# Patient Record
Sex: Female | Born: 1960 | Race: White | Hispanic: No | Marital: Married | Smoking: Never smoker
Health system: Southern US, Community
[De-identification: ages and names within clinical notes are randomized; demographics above are authoritative.]

## PROBLEM LIST (undated history)

## (undated) DIAGNOSIS — F329 Major depressive disorder, single episode, unspecified: Secondary | ICD-10-CM

## (undated) DIAGNOSIS — I10 Essential (primary) hypertension: Secondary | ICD-10-CM

## (undated) DIAGNOSIS — E079 Disorder of thyroid, unspecified: Secondary | ICD-10-CM

## (undated) DIAGNOSIS — F32A Depression, unspecified: Secondary | ICD-10-CM

## (undated) HISTORY — PX: REDUCTION MAMMAPLASTY: SUR839

## (undated) HISTORY — DX: Disorder of thyroid, unspecified: E07.9

## (undated) HISTORY — DX: Essential (primary) hypertension: I10

## (undated) HISTORY — DX: Major depressive disorder, single episode, unspecified: F32.9

## (undated) HISTORY — DX: Depression, unspecified: F32.A

## (undated) HISTORY — PX: BREAST BIOPSY: SHX20

---

## 1999-11-13 HISTORY — PX: GASTRIC BYPASS: SHX52

## 2003-10-13 HISTORY — PX: BREAST REDUCTION SURGERY: SHX8

## 2007-03-13 HISTORY — PX: ABDOMINOPLASTY: SUR9

## 2007-12-08 ENCOUNTER — Encounter: Payer: Self-pay | Admitting: Family Medicine

## 2008-02-03 ENCOUNTER — Encounter: Payer: Self-pay | Admitting: Family Medicine

## 2008-08-05 ENCOUNTER — Encounter: Payer: Self-pay | Admitting: Family Medicine

## 2008-12-16 ENCOUNTER — Encounter (INDEPENDENT_AMBULATORY_CARE_PROVIDER_SITE_OTHER): Payer: Self-pay | Admitting: *Deleted

## 2008-12-31 ENCOUNTER — Ambulatory Visit: Payer: Self-pay | Admitting: Family Medicine

## 2008-12-31 ENCOUNTER — Encounter (INDEPENDENT_AMBULATORY_CARE_PROVIDER_SITE_OTHER): Payer: Self-pay | Admitting: *Deleted

## 2008-12-31 DIAGNOSIS — E039 Hypothyroidism, unspecified: Secondary | ICD-10-CM

## 2008-12-31 DIAGNOSIS — I10 Essential (primary) hypertension: Secondary | ICD-10-CM

## 2008-12-31 DIAGNOSIS — F329 Major depressive disorder, single episode, unspecified: Secondary | ICD-10-CM

## 2008-12-31 DIAGNOSIS — J309 Allergic rhinitis, unspecified: Secondary | ICD-10-CM | POA: Insufficient documentation

## 2008-12-31 LAB — CONVERTED CEMR LAB
ALT: 47 units/L — ABNORMAL HIGH (ref 0–35)
AST: 32 units/L (ref 0–37)
Albumin: 3.9 g/dL (ref 3.5–5.2)
BUN: 10 mg/dL (ref 6–23)
CO2: 27 meq/L (ref 19–32)
Calcium: 9.3 mg/dL (ref 8.4–10.5)
Chloride: 106 meq/L (ref 96–112)
Creatinine, Ser: 0.7 mg/dL (ref 0.4–1.2)
Glucose, Bld: 90 mg/dL (ref 70–99)
TSH: 1.08 microintl units/mL (ref 0.35–5.50)
Total Bilirubin: 0.7 mg/dL (ref 0.3–1.2)
Total Protein: 7.4 g/dL (ref 6.0–8.3)

## 2009-01-07 ENCOUNTER — Encounter: Payer: Self-pay | Admitting: Family Medicine

## 2009-01-17 ENCOUNTER — Encounter (INDEPENDENT_AMBULATORY_CARE_PROVIDER_SITE_OTHER): Payer: Self-pay | Admitting: *Deleted

## 2009-02-25 ENCOUNTER — Ambulatory Visit: Payer: Self-pay | Admitting: Internal Medicine

## 2009-02-25 DIAGNOSIS — R319 Hematuria, unspecified: Secondary | ICD-10-CM

## 2009-02-25 DIAGNOSIS — R109 Unspecified abdominal pain: Secondary | ICD-10-CM

## 2009-02-25 LAB — CONVERTED CEMR LAB
Bacteria, UA: NONE SEEN
Glucose, Urine, Semiquant: NEGATIVE
Nitrite: NEGATIVE
Specific Gravity, Urine: 1.01
WBC Urine, dipstick: NEGATIVE
WBC, UA: NONE SEEN cells/hpf (ref <3)

## 2009-02-26 ENCOUNTER — Encounter: Payer: Self-pay | Admitting: Internal Medicine

## 2009-03-01 ENCOUNTER — Telehealth (INDEPENDENT_AMBULATORY_CARE_PROVIDER_SITE_OTHER): Payer: Self-pay | Admitting: *Deleted

## 2009-03-11 ENCOUNTER — Ambulatory Visit: Payer: Self-pay | Admitting: Family Medicine

## 2009-03-11 LAB — CONVERTED CEMR LAB
Bacteria, UA: NONE SEEN
Basophils Absolute: 0.1 10*3/uL (ref 0.0–0.1)
Basophils Relative: 0.7 % (ref 0.0–3.0)
Eosinophils Relative: 2.5 % (ref 0.0–5.0)
GFR calc non Af Amer: 113.03 mL/min (ref >60)
Glucose, Bld: 81 mg/dL (ref 70–99)
Glucose, Urine, Semiquant: NEGATIVE
HCT: 41.5 % (ref 36.0–46.0)
Hemoglobin: 14.6 g/dL (ref 12.0–15.0)
Ketones, urine, test strip: NEGATIVE
Lymphs Abs: 1.8 10*3/uL (ref 0.7–4.0)
Monocytes Relative: 8.2 % (ref 3.0–12.0)
Neutro Abs: 4.9 10*3/uL (ref 1.4–7.7)
Potassium: 4.3 meq/L (ref 3.5–5.1)
RBC: 4.58 M/uL (ref 3.87–5.11)
RDW: 12.1 % (ref 11.5–14.6)
Sodium: 141 meq/L (ref 135–145)
pH: 6

## 2009-03-12 ENCOUNTER — Encounter: Payer: Self-pay | Admitting: Family Medicine

## 2009-03-14 ENCOUNTER — Telehealth: Payer: Self-pay | Admitting: Family Medicine

## 2009-03-14 ENCOUNTER — Telehealth (INDEPENDENT_AMBULATORY_CARE_PROVIDER_SITE_OTHER): Payer: Self-pay | Admitting: *Deleted

## 2009-03-16 ENCOUNTER — Encounter: Admission: RE | Admit: 2009-03-16 | Discharge: 2009-03-16 | Payer: Self-pay | Admitting: Family Medicine

## 2009-03-18 DIAGNOSIS — R1909 Other intra-abdominal and pelvic swelling, mass and lump: Secondary | ICD-10-CM

## 2009-04-06 ENCOUNTER — Encounter: Payer: Self-pay | Admitting: Family Medicine

## 2009-04-19 ENCOUNTER — Ambulatory Visit: Payer: Self-pay | Admitting: Family Medicine

## 2009-04-19 LAB — CONVERTED CEMR LAB
Bilirubin Urine: NEGATIVE
Glucose, Urine, Semiquant: NEGATIVE
Ketones, urine, test strip: NEGATIVE
Protein, U semiquant: NEGATIVE

## 2009-08-29 ENCOUNTER — Telehealth (INDEPENDENT_AMBULATORY_CARE_PROVIDER_SITE_OTHER): Payer: Self-pay | Admitting: *Deleted

## 2009-09-02 ENCOUNTER — Telehealth (INDEPENDENT_AMBULATORY_CARE_PROVIDER_SITE_OTHER): Payer: Self-pay | Admitting: *Deleted

## 2009-09-06 ENCOUNTER — Telehealth (INDEPENDENT_AMBULATORY_CARE_PROVIDER_SITE_OTHER): Payer: Self-pay | Admitting: *Deleted

## 2009-09-08 ENCOUNTER — Telehealth (INDEPENDENT_AMBULATORY_CARE_PROVIDER_SITE_OTHER): Payer: Self-pay | Admitting: *Deleted

## 2009-09-12 ENCOUNTER — Ambulatory Visit: Payer: Self-pay | Admitting: Family Medicine

## 2009-10-11 ENCOUNTER — Ambulatory Visit: Payer: Self-pay | Admitting: Family Medicine

## 2009-10-11 ENCOUNTER — Encounter: Payer: Self-pay | Admitting: Family Medicine

## 2009-10-11 DIAGNOSIS — J019 Acute sinusitis, unspecified: Secondary | ICD-10-CM

## 2009-10-20 ENCOUNTER — Telehealth: Payer: Self-pay | Admitting: Family Medicine

## 2009-10-31 ENCOUNTER — Ambulatory Visit: Payer: Self-pay | Admitting: Family Medicine

## 2009-10-31 ENCOUNTER — Other Ambulatory Visit: Admission: RE | Admit: 2009-10-31 | Discharge: 2009-10-31 | Payer: Self-pay | Admitting: Family Medicine

## 2009-11-01 ENCOUNTER — Telehealth (INDEPENDENT_AMBULATORY_CARE_PROVIDER_SITE_OTHER): Payer: Self-pay | Admitting: *Deleted

## 2009-11-01 LAB — CONVERTED CEMR LAB
Albumin: 4 g/dL (ref 3.5–5.2)
Basophils Relative: 0.6 % (ref 0.0–3.0)
CO2: 26 meq/L (ref 19–32)
Calcium: 9.4 mg/dL (ref 8.4–10.5)
Chloride: 111 meq/L (ref 96–112)
Cholesterol: 198 mg/dL (ref 0–200)
Creatinine, Ser: 0.8 mg/dL (ref 0.4–1.2)
Eosinophils Absolute: 0.2 10*3/uL (ref 0.0–0.7)
Eosinophils Relative: 2.9 % (ref 0.0–5.0)
HDL: 50.2 mg/dL (ref >39.00)
Hemoglobin: 14.5 g/dL (ref 12.0–15.0)
Iron: 105 ug/dL (ref 42–145)
LDL Cholesterol: 123 mg/dL — ABNORMAL HIGH (ref 0–99)
Lymphocytes Relative: 21.3 % (ref 12.0–46.0)
MCHC: 33.5 g/dL (ref 30.0–36.0)
MCV: 93.3 fL (ref 78.0–100.0)
Neutro Abs: 4.2 10*3/uL (ref 1.4–7.7)
Neutrophils Relative %: 67.6 % (ref 43.0–77.0)
RBC: 4.65 M/uL (ref 3.87–5.11)
Saturation Ratios: 32 % (ref 20.0–50.0)
Sodium: 146 meq/L — ABNORMAL HIGH (ref 135–145)
Total CHOL/HDL Ratio: 4
Total Protein: 7.5 g/dL (ref 6.0–8.3)
Triglycerides: 126 mg/dL (ref 0.0–149.0)
VLDL: 25.2 mg/dL (ref 0.0–40.0)
Vit D, 25-Hydroxy: 13 ng/mL — ABNORMAL LOW (ref 30–89)
Vitamin B-12: 439 pg/mL (ref 211–911)
WBC: 6.2 10*3/uL (ref 4.5–10.5)

## 2009-11-10 ENCOUNTER — Encounter (INDEPENDENT_AMBULATORY_CARE_PROVIDER_SITE_OTHER): Payer: Self-pay | Admitting: *Deleted

## 2010-01-18 ENCOUNTER — Telehealth (INDEPENDENT_AMBULATORY_CARE_PROVIDER_SITE_OTHER): Payer: Self-pay | Admitting: *Deleted

## 2010-01-20 ENCOUNTER — Ambulatory Visit: Payer: Self-pay | Admitting: Family Medicine

## 2010-02-16 ENCOUNTER — Telehealth (INDEPENDENT_AMBULATORY_CARE_PROVIDER_SITE_OTHER): Payer: Self-pay | Admitting: *Deleted

## 2010-03-01 ENCOUNTER — Telehealth (INDEPENDENT_AMBULATORY_CARE_PROVIDER_SITE_OTHER): Payer: Self-pay | Admitting: *Deleted

## 2010-03-27 ENCOUNTER — Telehealth (INDEPENDENT_AMBULATORY_CARE_PROVIDER_SITE_OTHER): Payer: Self-pay | Admitting: *Deleted

## 2010-04-13 ENCOUNTER — Ambulatory Visit: Payer: Self-pay | Admitting: Family Medicine

## 2010-04-13 LAB — CONVERTED CEMR LAB: TSH: 1.21 microintl units/mL (ref 0.35–5.50)

## 2010-05-01 ENCOUNTER — Telehealth (INDEPENDENT_AMBULATORY_CARE_PROVIDER_SITE_OTHER): Payer: Self-pay | Admitting: *Deleted

## 2010-05-03 ENCOUNTER — Telehealth (INDEPENDENT_AMBULATORY_CARE_PROVIDER_SITE_OTHER): Payer: Self-pay | Admitting: *Deleted

## 2010-10-31 ENCOUNTER — Telehealth (INDEPENDENT_AMBULATORY_CARE_PROVIDER_SITE_OTHER): Payer: Self-pay | Admitting: *Deleted

## 2010-12-04 ENCOUNTER — Encounter: Payer: Self-pay | Admitting: Family Medicine

## 2010-12-12 NOTE — Progress Notes (Signed)
Summary: Refill Request  Phone Note Refill Request Message from:  Pharmacy on [address]  on [facility] Fax #: [phone]  Refills Requested: Medication #1:  SYNTHROID 137 MCG TABS 1 by mouth daily- LABS DUE NOW   Dosage confirmed as above?Dosage Confirmed   Brand Name Necessary? No   Supply Requested: 1 month   Last Refilled: 02/16/2010 Next Appointment Scheduled: none Initial call taken by: Harold Barban,  February 16, 2010 9:24 AM    Prescriptions: SYNTHROID 137 MCG TABS (LEVOTHYROXINE SODIUM) 1 by mouth daily- LABS DUE NOW  #30 x 0   Entered by:   Army Fossa CMA   Authorized by:   Loreen Freud DO   Signed by:   Army Fossa CMA on 02/16/2010   Method used:   Printed then faxed to ...         RxID:   [id number]

## 2010-12-12 NOTE — Progress Notes (Signed)
Summary: tooth infection  Phone Note Call from Patient Call back at [phone]   Caller: Patient Summary of Call: spoke w/ patient says she just completed keflex for root canal after tooth infection had some nausea and no fever but noted some chills informed patient could be from current infection and give medication sometime since still her system also to take tyenol as needed if she feels like she has fever and f/u at the end of the week if no better may need further evaluation of symptoms patient agreed and will call back at end of week if no better. Initial call taken by: Doristine Devoid,  May 03, 2010 1:53 PM

## 2010-12-12 NOTE — Progress Notes (Signed)
Summary: synthroid refill   Phone Note Refill Request Message from:  Fax from Pharmacy on January 18, 2010 10:46 AM  Refills Requested: Medication #1:  SYNTHROID 137 MCG TABS 1 by mouth daily.[facility] fax [phone]   Method Requested: Fax to Local Pharmacy Next Appointment Scheduled: no appt Initial call taken by: Barb Merino,  January 18, 2010 10:46 AM    New/Updated Medications: SYNTHROID 137 MCG TABS (LEVOTHYROXINE SODIUM) 1 by mouth daily- LABS DUE NOW Prescriptions: SYNTHROID 137 MCG TABS (LEVOTHYROXINE SODIUM) 1 by mouth daily- LABS DUE NOW  #30 x 0   Entered by:   Doristine Devoid   Authorized by:   Neena Rhymes MD   Signed by:   Doristine Devoid on 01/18/2010   Method used:   Printed then faxed to ...         RxID:   [id number]

## 2010-12-12 NOTE — Progress Notes (Signed)
Summary: Refill Request(lmom 4/20,4/21)  Phone Note Refill Request Message from:  Pharmacy on [address] on [facility] Fax #: [phone]  Refills Requested: Medication #1:  SYNTHROID 137 MCG TABS 1 by mouth daily- LABS DUE NOW   Dosage confirmed as above?Dosage Confirmed   Brand Name Necessary? No   Supply Requested: 1 month   Last Refilled: 02/22/2010 Next Appointment Scheduled: none Initial call taken by: Harold Barban,  March 01, 2010 9:37 AM  Follow-up for Phone Call        left message for pt to call back. med was refilled on 02/16/10 she should not need medication. Army Fossa CMA  March 01, 2010 10:15 AM   Additional Follow-up for Phone Call Additional follow up Details #1::        Left message to call back. Army Fossa CMA  March 02, 2010 11:50 AM

## 2010-12-12 NOTE — Progress Notes (Signed)
Summary: Refill Requests  Phone Note Refill Request Call back at [phone] Message from:  Pharmacy on Mar 27, 2010 8:51 AM  Refills Requested: Medication #1:  LISINOPRIL 20 MG TABS 1 tablet by mouth daily   Dosage confirmed as above?Dosage Confirmed   Supply Requested: 1 month   Last Refilled: 02/24/2010  Medication #2:  SYNTHROID 137 MCG TABS 1 by mouth daily- LABS DUE NOW   Dosage confirmed as above?Dosage Confirmed   Brand Name Necessary? No   Supply Requested: 1 month   Last Refilled: 02/22/2010  Medication #3:  EFFEXOR XR 150 MG XR24H-CAP 1 by mouth once daily   Dosage confirmed as above?Dosage Confirmed   Brand Name Necessary? No   Supply Requested: 1 month   Last Refilled: 02/21/2010 [facility] on [facility] in [place]  Next Appointment Scheduled: none Initial call taken by: Harold Barban,  Mar 27, 2010 8:51 AM    Prescriptions: SYNTHROID 137 MCG TABS (LEVOTHYROXINE SODIUM) 1 by mouth daily- LABS DUE NOW  #30 x 0   Entered by:   Doristine Devoid   Authorized by:   Neena Rhymes MD   Signed by:   Doristine Devoid on 03/27/2010   Method used:   Printed then faxed to ...         RxID:   [id number] LISINOPRIL 20 MG TABS (LISINOPRIL) 1 tablet by mouth daily  #30 x 0   Entered by:   Doristine Devoid   Authorized by:   Neena Rhymes MD   Signed by:   Doristine Devoid on 03/27/2010   Method used:   Printed then faxed to ...         RxID:   [id number] EFFEXOR XR 150 MG XR24H-CAP (VENLAFAXINE HCL) 1 by mouth once daily  #30 x 0   Entered by:   Doristine Devoid   Authorized by:   Neena Rhymes MD   Signed by:   Doristine Devoid on 03/27/2010   Method used:   Printed then faxed to ...         RxID:   [id number]

## 2010-12-12 NOTE — Assessment & Plan Note (Signed)
Summary: sinus infection/kdc   Vital Signs:  Patient profile:   50 year old female Height:      65 inches Weight:      222 pounds BMI:     37.08 Temp:     98.0 degrees F oral Pulse rate:   82 / minute Pulse rhythm:   regular BP sitting:   122 / 80  (left arm) Cuff size:   large  Vitals Entered By: Army Fossa CMA (January 20, 2010 2:52 PM) CC: Pt c/o pressure in her face, coughing, head congestion x 2-3 days., URI symptoms   History of Present Illness:       This is a 50 year old woman who presents with URI symptoms.  The symptoms began 3 days ago.  The patient complains of nasal congestion, sore throat, dry cough, and sick contacts.  The patient denies fever, low-grade fever (<100.5 degrees), fever of 100.5-103 degrees, fever of 103.1-104 degrees, fever to >104 degrees, stiff neck, dyspnea, wheezing, rash, vomiting, diarrhea, use of an antipyretic, and response to antipyretic.  The patient also reports headache.  The patient denies itchy watery eyes, itchy throat, sneezing, seasonal symptoms, response to antihistamine, muscle aches, and severe fatigue.  Risk factors for Strep sinusitis include unilateral facial pain.  The patient denies the following risk factors for Strep sinusitis: unilateral nasal discharge, poor response to decongestant, double sickening, tooth pain, Strep exposure, tender adenopathy, and absence of cough.  Pt tried Comtrex last night.  Cough is worse at night.    Current Medications (verified): 1)  Effexor Xr 150 Mg Xr24h-Cap (Venlafaxine Hcl) .Marland Kitchen.. 1 By Mouth Once Daily 2)  Estrace 0.1 Mg/gm Crea (Estradiol) .... Use As Directed 3)  Claritin 10 Mg Tabs (Loratadine) .... Take 1 Tab Once Daily 4)  Vitamin D 1000 Unit Tabs (Cholecalciferol) .... Take 1 Tab Once Daily 5)  Vitamin C 500 Mg Tabs (Ascorbic Acid) .... Take 1 Tab Once Daily 6)  Cranberry 1680 Mg .... Take 2 Tab Once Daily 7)  Vitamin B-12 1000 Mcg Subl (Cyanocobalamin) .Marland Kitchen.. 1 By Mouth Weekly. 8)   Lisinopril 20 Mg Tabs (Lisinopril) .Marland Kitchen.. 1 Tablet By Mouth Daily 9)  Veramyst 27.5 Mcg/spray Susp (Fluticasone Furoate) 10)  Diflucan 150 Mg Tabs (Fluconazole) .Marland Kitchen.. 1 By Mouth X1 . May Repeat in 3 Days As Needed 11)  Synthroid 137 Mcg Tabs (Levothyroxine Sodium) .Marland Kitchen.. 1 By Mouth Daily- Labs Due Now 12)  Augmentin 875-125 Mg Tabs (Amoxicillin-Pot Clavulanate) .Marland Kitchen.. 1 By Mouth Two Times A Day 13)  Cheratussin Ac 100-10 Mg/77ml Syrp (Guaifenesin-Codeine) .Marland Kitchen.. 1-2 Tsp By Mouth At Bedtime  Allergies (verified): No Known Drug Allergies  Past History:  Past medical, surgical, family and social histories (including risk factors) reviewed for relevance to current acute and chronic problems.  Past Medical History: Reviewed history from 12/31/2008 and no changes required. HTN depression  Past Surgical History: Reviewed history from 01/17/2009 and no changes required. Gastric bypass-2001 Mammoplasty-10/2003 Abdominoplasty-03/2007 Broken foot- 1999  Family History: Reviewed history from 12/31/2008 and no changes required. dm-father htn-mother stroke-father coronary artery disease- paternal gf breast ca- paternal aunt bladder Ca-father, paternal GM  Social History: Reviewed history from 12/31/2008 and no changes required. recently moved from [place], [place] before that but family in [place] ex-husband is gay, 2 children- 5, 98 no tobacco beer nightly no drugs  Review of Systems      See HPI  Physical Exam  General:  Well-developed,well-nourished,in no acute distress; alert,appropriate and cooperative throughout examination Ears:  External ear exam shows no significant lesions or deformities.  Otoscopic examination reveals clear canals, tympanic membranes are intact bilaterally without bulging, retraction, inflammation or discharge. Hearing is grossly normal bilaterally. Nose:  L frontal sinus tenderness and L maxillary sinus tenderness.   Mouth:  Oral mucosa and oropharynx without  lesions or exudates.  Teeth in good repair. Neck:  No deformities, masses, or tenderness noted. Lungs:  Normal respiratory effort, chest expands symmetrically. Lungs are clear to auscultation, no crackles or wheezes. Heart:  normal rate and no murmur.   Psych:  Oriented X3 and normally interactive.     Impression & Recommendations:  Problem # 1:  SINUSITIS - ACUTE-NOS (ICD-461.9)  Her updated medication list for this problem includes:    Veramyst 27.5 Mcg/spray Susp (Fluticasone furoate)    Augmentin 875-125 Mg Tabs (Amoxicillin-pot clavulanate) .Marland Kitchen... 1 by mouth two times a day    Cheratussin Ac 100-10 Mg/53ml Syrp (Guaifenesin-codeine) .Marland Kitchen... 1-2 tsp by mouth at bedtime  Instructed on treatment. Call if symptoms persist or worsen.   Complete Medication List: 1)  Effexor Xr 150 Mg Xr24h-cap (Venlafaxine hcl) .Marland Kitchen.. 1 by mouth once daily 2)  Estrace 0.1 Mg/gm Crea (Estradiol) .... Use as directed 3)  Claritin 10 Mg Tabs (Loratadine) .... Take 1 tab once daily 4)  Vitamin D 1000 Unit Tabs (Cholecalciferol) .... Take 1 tab once daily 5)  Vitamin C 500 Mg Tabs (Ascorbic acid) .... Take 1 tab once daily 6)  Cranberry 1680 Mg  .... Take 2 tab once daily 7)  Vitamin B-12 1000 Mcg Subl (Cyanocobalamin) .Marland Kitchen.. 1 by mouth weekly. 8)  Lisinopril 20 Mg Tabs (Lisinopril) .Marland Kitchen.. 1 tablet by mouth daily 9)  Veramyst 27.5 Mcg/spray Susp (Fluticasone furoate) 10)  Diflucan 150 Mg Tabs (Fluconazole) .Marland Kitchen.. 1 by mouth x1 . may repeat in 3 days as needed 11)  Synthroid 137 Mcg Tabs (Levothyroxine sodium) .Marland Kitchen.. 1 by mouth daily- labs due now 12)  Augmentin 875-125 Mg Tabs (Amoxicillin-pot clavulanate) .Marland Kitchen.. 1 by mouth two times a day 13)  Cheratussin Ac 100-10 Mg/30ml Syrp (Guaifenesin-codeine) .Marland Kitchen.. 1-2 tsp by mouth at bedtime Prescriptions: CHERATUSSIN AC 100-10 MG/5ML SYRP (GUAIFENESIN-CODEINE) 1-2 tsp by mouth at bedtime  #6 oz x 0   Entered and Authorized by:   Loreen Freud DO   Signed by:   Loreen Freud DO on  01/20/2010   Method used:   Print then Give to Patient   RxID:   [id number] AUGMENTIN 875-125 MG TABS (AMOXICILLIN-POT CLAVULANATE) 1 by mouth two times a day  #20 x 0   Entered and Authorized by:   Loreen Freud DO   Signed by:   Loreen Freud DO on 01/20/2010   Method used:   Electronically to        [facility] [phone][facility] (retail)       [address]       [place], [place]  [address]       Ph: [phone]       Fax: [phone]   RxID:   [id number]

## 2010-12-12 NOTE — Progress Notes (Signed)
Summary: synthroid,effexor,lisinopril refill   Phone Note Refill Request Call back at [phone] Message from:  Pharmacy on May 01, 2010 9:31 AM  Refills Requested: Medication #1:  SYNTHROID 137 MCG TABS 1 by mouth daily- LABS DUE NOW   Dosage confirmed as above?Dosage Confirmed   Brand Name Necessary? No   Supply Requested: 1 month   Last Refilled: 03/27/2010  Medication #2:  EFFEXOR XR 150 MG XR24H-CAP 1 by mouth once daily   Dosage confirmed as above?Dosage Confirmed   Brand Name Necessary? No   Supply Requested: 1 month   Last Refilled: 03/27/2010  Medication #3:  LISINOPRIL 20 MG TABS 1 tablet by mouth daily   Dosage confirmed as above?Dosage Confirmed   Supply Requested: 1 month   Last Refilled: 03/27/2010 [facility] on [facility] in [place]  Next Appointment Scheduled: none Initial call taken by: Harold Barban,  May 01, 2010 9:32 AM    New/Updated Medications: SYNTHROID 137 MCG TABS (LEVOTHYROXINE SODIUM) 1 by mouth daily Prescriptions: SYNTHROID 137 MCG TABS (LEVOTHYROXINE SODIUM) 1 by mouth daily  #30 x 6   Entered by:   Doristine Devoid   Authorized by:   Neena Rhymes MD   Signed by:   Doristine Devoid on 05/01/2010   Method used:   Printed then faxed to ...         RxID:   [id number] LISINOPRIL 20 MG TABS (LISINOPRIL) 1 tablet by mouth daily  #30 x 6   Entered by:   Doristine Devoid   Authorized by:   Neena Rhymes MD   Signed by:   Doristine Devoid on 05/01/2010   Method used:   Printed then faxed to ...         RxID:   [id number] EFFEXOR XR 150 MG XR24H-CAP (VENLAFAXINE HCL) 1 by mouth once daily  #30 x 6   Entered by:   Doristine Devoid   Authorized by:   Neena Rhymes MD   Signed by:   Doristine Devoid on 05/01/2010   Method used:   Printed then faxed to ...         RxID:   [id number]

## 2010-12-14 NOTE — Progress Notes (Signed)
Summary: refill- due cpx and labs for further refills   Phone Note Refill Request Message from:  Fax from Pharmacy on October 31, 2010 9:50 AM  Refills Requested: Medication #1:  EFFEXOR XR 150 MG XR24H-CAP 1 by mouth once daily  Medication #2:  SYNTHROID 137 MCG TABS 1 by mouth daily  Medication #3:  LISINOPRIL 20 MG TABS 1 tablet by mouth daily [facility] [place] -fax [phone]   Initial call taken by: Okey Regal [place],  October 31, 2010 9:52 AM    Prescriptions: SYNTHROID 137 MCG TABS (LEVOTHYROXINE SODIUM) 1 by mouth daily  #30 x 1   Entered by:   Doristine Devoid CMA   Authorized by:   Neena Rhymes MD   Signed by:   Doristine Devoid CMA on 10/31/2010   Method used:   Reprint   RxID:   [id number] LISINOPRIL 20 MG TABS (LISINOPRIL) 1 tablet by mouth daily  #30 x 1   Entered by:   Doristine Devoid CMA   Authorized by:   Neena Rhymes MD   Signed by:   Doristine Devoid CMA on 10/31/2010   Method used:   Reprint   RxID:   [id number] EFFEXOR XR 150 MG XR24H-CAP (VENLAFAXINE HCL) 1 by mouth once daily  #30 x 1   Entered by:   Doristine Devoid CMA   Authorized by:   Neena Rhymes MD   Signed by:   Doristine Devoid CMA on 10/31/2010   Method used:   Reprint   RxID:   [id number]

## 2011-01-31 ENCOUNTER — Other Ambulatory Visit: Payer: Self-pay | Admitting: Family Medicine

## 2011-01-31 NOTE — Telephone Encounter (Signed)
Left message at home to call back to the office.

## 2011-01-31 NOTE — Telephone Encounter (Signed)
Ok to refill each for 1 month but pt needs to have CPE scheduled before additional refills will be given.

## 2011-01-31 NOTE — Telephone Encounter (Signed)
Prior request from December notes that pt is due for CPX and labs before further refills, but I cannot tell if this was conveyed to the pt. Please advise.

## 2011-02-01 NOTE — Telephone Encounter (Signed)
Left message on voicemail to call the office

## 2011-02-02 ENCOUNTER — Other Ambulatory Visit: Payer: Self-pay | Admitting: Family Medicine

## 2011-02-02 MED ORDER — VENLAFAXINE HCL ER 75 MG PO CP24: ORAL_CAPSULE | ORAL | Status: DC

## 2011-02-02 MED ORDER — LEVOTHYROXINE SODIUM 137 MCG PO TABS: 137.0000 ug | ORAL_TABLET | Freq: Every day | ORAL | Status: DC

## 2011-02-02 MED ORDER — LISINOPRIL 20 MG PO TABS: 20.0000 mg | ORAL_TABLET | Freq: Every day | ORAL | Status: DC

## 2011-02-02 NOTE — Telephone Encounter (Signed)
No return call from pt, Left message on voicemail to call the office. Mobile number gives automated message that user is unavailable.

## 2011-02-02 NOTE — Telephone Encounter (Signed)
No return call from pt, sent rx's to pharmacy so pt will not run out over weekend with notation that ov is needed.

## 2011-02-02 NOTE — Telephone Encounter (Signed)
Duplicate requests, I am awaiting return call from the pt.

## 2011-02-16 ENCOUNTER — Encounter: Payer: Self-pay | Admitting: Family Medicine

## 2011-02-19 ENCOUNTER — Encounter: Payer: Self-pay | Admitting: Family Medicine

## 2011-02-19 ENCOUNTER — Ambulatory Visit (INDEPENDENT_AMBULATORY_CARE_PROVIDER_SITE_OTHER): Payer: 59 | Admitting: Family Medicine

## 2011-02-19 VITALS — BP 120/74 | HR 76 | Wt 235.6 lb

## 2011-02-19 DIAGNOSIS — E039 Hypothyroidism, unspecified: Secondary | ICD-10-CM

## 2011-02-19 DIAGNOSIS — I10 Essential (primary) hypertension: Secondary | ICD-10-CM

## 2011-02-19 DIAGNOSIS — Z780 Asymptomatic menopausal state: Secondary | ICD-10-CM

## 2011-02-19 DIAGNOSIS — F329 Major depressive disorder, single episode, unspecified: Secondary | ICD-10-CM

## 2011-02-19 MED ORDER — VENLAFAXINE HCL ER 75 MG PO CP24: ORAL_CAPSULE | ORAL | Status: DC

## 2011-02-19 MED ORDER — ESTRADIOL 0.1 MG/GM VA CREA: 2.0000 g | TOPICAL_CREAM | Freq: Every day | VAGINAL | Status: DC

## 2011-02-19 MED ORDER — LEVOTHYROXINE SODIUM 137 MCG PO TABS: 137.0000 ug | ORAL_TABLET | Freq: Every day | ORAL | Status: DC

## 2011-02-19 MED ORDER — LISINOPRIL 20 MG PO TABS: 20.0000 mg | ORAL_TABLET | Freq: Every day | ORAL | Status: DC

## 2011-02-19 NOTE — Patient Instructions (Signed)
Hypothyroidism The thyroid is a large gland located in the lower front of your neck. The thyroid gland helps control metabolism. Metabolism is how your body handles food. It controls metabolism with the hormone thyroxine. When this gland is underactive (hypothyroid), it produces too little hormone.  SYMPTOMS OF HYPOTHYROIDISM  Lethargy (feeling as though you have no energy)   Cold intolerance   Weight gain (in spite of normal food intake)   Dry skin   Coarse hair   Menstrual irregularity (if severe, may lead to infertility)   Slowing of thought processes  Cardiac problems are also caused by insufficient amounts of thyroid hormone. Hypothyroidism in the newborn is cretinism, and is an extreme form. It is important that this form be treated adequately and immediately or it will lead rapidly to retarded physical and mental development. CAUSES OF HYPOTHYROIDISM These include:   Absence or destruction of thyroid tissue.  Goiter due to iodine deficiency.   Goiter due to medications.   Congenital defects (since birth).  Problems with the pituitary. This causes a lack of TSH (thyroid stimulating hormone). This hormone tells the thyroid to turn out more hormone.   DIAGNOSIS To prove hypothyroidism, your caregiver may do blood tests and ultrasound tests. Sometimes the signs are hidden. It may be necessary for your caregiver to watch this illness with blood tests either before or after diagnosis and treatment. TREATMENT  Low levels of thyroid hormone are increased by using synthetic thyroid hormone. This is a safe, effective treatment. It usually takes about four weeks to gain the full effects of the medication. After you have the full effect of the medication, it will generally take another four weeks for problems to leave. Your caregiver may start you on low doses. If you have had heart problems the dose may be gradually increased. It is generally not an emergency to get rapidly to  normal. HOME CARE INSTRUCTIONS  Take your medications as your caregiver suggests. Let your caregiver know of any medications you are taking or start taking. Your caregiver will help you with dosage schedules.   As your condition improves, your dosage needs may increase. It will be necessary to have continuing blood tests as suggested by your caregiver.   Report all suspected medication side effects to your caregiver.  SEEK MEDICAL CARE IF YOU DEVELOP:  Sweating.  Tremulousness (tremors).   Anxiety.   Rapid weight loss.   Heat intolerance.  Emotional swings.   Diarrhea.   Weakness.   SEEK IMMEDIATE MEDICAL CARE IF: You develop chest pain, an irregular heart beat (palpitations), or a rapid heart beat. MAKE SURE YOU:   Understand these instructions.   Will watch your condition.   Will get help right away if you are not doing well or get worse.  Document Released: 10/29/2005 Document Re-Released: 10/11/2008 [facility] Patient Information 2011 [place], [place].Hypertension (High Blood Pressure) As your heart beats, it forces blood through your arteries. This force is your blood pressure. If the pressure is too high, it is called hypertension (HTN) or high blood pressure. HTN is dangerous because you may have it and not know it. High blood pressure may mean that your heart has to work harder to pump blood. Your arteries may be narrow or stiff. The extra work puts you at risk for heart disease, stroke, and other problems.  Blood pressure consists of two numbers, a higher number over a lower, 110/72, for example. It is stated as "110 over 72." The ideal is below 120 for  the top number (systolic) and under 80 for the bottom (diastolic). Write down your blood pressure today. You should pay close attention to your blood pressure if you have certain conditions such as:  Heart failure.  Prior heart attack.   Diabetes   Chronic kidney disease.   Prior stroke.   Multiple risk factors  for heart disease.   To see if you have HTN, your blood pressure should be measured while you are seated with your arm held at the level of the heart. It should be measured at least twice. A one-time elevated blood pressure reading (especially in the Emergency Department) does not mean that you need treatment. There may be conditions in which the blood pressure is different between your right and left arms. It is important to see your caregiver soon for a recheck. Most people have essential hypertension which means that there is not a specific cause. This type of high blood pressure may be lowered by changing lifestyle factors such as:  Stress.  Smoking.   Lack of exercise.   Excessive weight.  Drug/tobacco/alcohol use.   Eating less salt.   Most people do not have symptoms from high blood pressure until it has caused damage to the body. Effective treatment can often prevent, delay or reduce that damage. TREATMENT Treatment for high blood pressure, when a cause has been identified, is directed at the cause. There are a large number of medications to treat HTN. These fall into several categories, and your caregiver will help you select the medicines that are best for you. Medications may have side effects. You should review side effects with your caregiver. If your blood pressure stays high after you have made lifestyle changes or started on medicines,   Your medication(s) may need to be changed.   Other problems may need to be addressed.   Be certain you understand your prescriptions, and know how and when to take your medicine.   Be sure to follow up with your caregiver within the time frame advised (usually within two weeks) to have your blood pressure rechecked and to review your medications.   If you are taking more than one medicine to lower your blood pressure, make sure you know how and at what times they should be taken. Taking two medicines at the same time can result in blood  pressure that is too low.  SEEK IMMEDIATE MEDICAL CARE IF YOU DEVELOP:  A severe headache, blurred or changing vision, or confusion.   Unusual weakness or numbness, or a faint feeling.   Severe chest or abdominal pain, vomiting, or breathing problems.  MAKE SURE YOU:   Understand these instructions.   Will watch your condition.   Will get help right away if you are not doing well or get worse.  Document Released: 10/29/2005 Document Re-Released: 04/18/2010 [facility] Patient Information 2011 [place], [place].

## 2011-02-19 NOTE — Progress Notes (Signed)
  Subjective:    Patient here for follow-up of elevated blood pressure.  She is not exercising and is adherent to a low-salt diet.  Blood pressure is not being taken at home. Cardiac symptoms: none. Patient denies: chest pain, chest pressure/discomfort, dyspnea and fatigue. Cardiovascular risk factors: hypertension, obesity (BMI >= 30 kg/m2) and sedentary lifestyle. Use of agents associated with hypertension: none. History of target organ damage: none.  Pt is also here for med refill and labs.  Pt is happy with effexor dose.  No changes needed and no symptoms of hypo or hyper thyroidism  The following portions of the patient's history were reviewed and updated as appropriate: allergies, current medications, past family history, past medical history, past social history, past surgical history and problem list.  Review of Systems Pertinent items are noted in HPI.     Objective:    BP 120/74  Pulse 76  Wt 235 lb 9.6 oz (106.867 kg) General appearance: alert, cooperative, appears stated age and no distress Lungs: clear to auscultation bilaterally Heart: regular rate and rhythm, S1, S2 normal, no murmur, click, rub or gallop Extremities: extremities normal, atraumatic, no cyanosis or edema Pulses: 2+ and symmetric   neck--no thyroidmegaly Psych--no anxiety/depression,  Not suicidal.  AAOx3  Assessment:    Hypertension, normal blood pressure . Evidence of target organ damage: none.   Hypothroidism--con't meds,  Check labs Depression--con't meds Plan:    Medication: no change. Dietary sodium restriction. Regular aerobic exercise. Check blood pressures 2-3 times weekly and record. Follow up: 6 months and as needed.

## 2011-02-23 ENCOUNTER — Other Ambulatory Visit (INDEPENDENT_AMBULATORY_CARE_PROVIDER_SITE_OTHER): Payer: 59

## 2011-02-23 DIAGNOSIS — E039 Hypothyroidism, unspecified: Secondary | ICD-10-CM

## 2011-02-23 DIAGNOSIS — I10 Essential (primary) hypertension: Secondary | ICD-10-CM

## 2011-02-23 LAB — BASIC METABOLIC PANEL
BUN: 15 mg/dL (ref 6–23)
Calcium: 10.1 mg/dL (ref 8.4–10.5)
Creatinine, Ser: 0.7 mg/dL (ref 0.4–1.2)
GFR: 88.03 mL/min (ref >60.00)
Glucose, Bld: 97 mg/dL (ref 70–99)

## 2011-02-26 ENCOUNTER — Encounter: Payer: Self-pay | Admitting: *Deleted

## 2011-08-24 ENCOUNTER — Encounter: Payer: 59 | Admitting: Family Medicine

## 2011-08-24 DIAGNOSIS — Z0289 Encounter for other administrative examinations: Secondary | ICD-10-CM

## 2011-12-28 ENCOUNTER — Other Ambulatory Visit: Payer: Self-pay | Admitting: Family Medicine

## 2011-12-31 NOTE — Telephone Encounter (Signed)
Noted last OV 02-19-11 with MD Laury Axon, noted many OV with [facility] however note PCP as [facility], Please advise

## 2012-01-01 ENCOUNTER — Other Ambulatory Visit: Payer: Self-pay | Admitting: Family Medicine

## 2012-01-03 NOTE — Telephone Encounter (Signed)
[facility] pt please advise 

## 2012-01-18 ENCOUNTER — Encounter: Payer: Self-pay | Admitting: Family Medicine

## 2012-01-18 ENCOUNTER — Other Ambulatory Visit: Payer: Self-pay | Admitting: Family Medicine

## 2012-01-18 ENCOUNTER — Ambulatory Visit (INDEPENDENT_AMBULATORY_CARE_PROVIDER_SITE_OTHER): Payer: 59 | Admitting: Family Medicine

## 2012-01-18 VITALS — BP 122/80 | HR 73 | Temp 98.2°F | Wt 232.2 lb

## 2012-01-18 DIAGNOSIS — R300 Dysuria: Secondary | ICD-10-CM

## 2012-01-18 DIAGNOSIS — N952 Postmenopausal atrophic vaginitis: Secondary | ICD-10-CM

## 2012-01-18 LAB — POCT URINALYSIS DIPSTICK
Bilirubin, UA: NEGATIVE
Ketones, UA: NEGATIVE
Leukocytes, UA: NEGATIVE

## 2012-01-18 MED ORDER — ESTROGENS, CONJUGATED 0.625 MG/GM VA CREA: TOPICAL_CREAM | VAGINAL | Status: DC

## 2012-01-18 NOTE — Progress Notes (Signed)
  Subjective:    Patient ID: Teresa Nixon, female    DOB: 1961/02/04, 51 y.o.   MRN: [id number]  HPI Pt here c/o vaginal dryness.  No d/c --some itching.  She has been treated for uti and yeast and is no better. No other complaints.   Review of Systems As above    Objective:   Physical Exam  Constitutional: She is oriented to person, place, and time. She appears well-developed and well-nourished.  Genitourinary: Vaginal discharge found.       Scant amount white d/c  + odor Culture done  Neurological: She is alert and oriented to person, place, and time.  Psychiatric: She has a normal mood and affect. Her behavior is normal.          Assessment & Plan:  Vaginal atropy---  Premarin cream if culture neg             Keep cpe appointment

## 2012-01-18 NOTE — Progress Notes (Signed)
Addended by: Arnette Norris on: 01/18/2012 02:44 PM   Modules accepted: Orders

## 2012-01-21 LAB — URINE CULTURE: Colony Count: >=100000

## 2012-01-22 LAB — WET PREP BY MOLECULAR PROBE: Candida species: NEGATIVE

## 2012-01-24 NOTE — Progress Notes (Signed)
Wet prep 

## 2012-01-31 ENCOUNTER — Other Ambulatory Visit: Payer: Self-pay | Admitting: Family Medicine

## 2012-01-31 ENCOUNTER — Telehealth: Payer: Self-pay | Admitting: Family Medicine

## 2012-01-31 MED ORDER — CIPROFLOXACIN HCL 500 MG PO TABS: 500.0000 mg | ORAL_TABLET | Freq: Two times a day (BID) | ORAL | Status: AC

## 2012-01-31 NOTE — Telephone Encounter (Signed)
Pt still c/o frequency and urgency with urination. Advise Pt culture came back and it was + for UTI cipro 500 mg 1 po bid for 5 days. Rx sent.

## 2012-01-31 NOTE — Telephone Encounter (Signed)
Patient called & stated her symptoms are no better, would you like for her to come back in or is there anything else that can be done for her? Please call patient at [phone]

## 2012-02-18 ENCOUNTER — Encounter: Payer: Self-pay | Admitting: Family Medicine

## 2012-02-18 ENCOUNTER — Ambulatory Visit (INDEPENDENT_AMBULATORY_CARE_PROVIDER_SITE_OTHER): Payer: 59 | Admitting: Family Medicine

## 2012-02-18 ENCOUNTER — Other Ambulatory Visit (HOSPITAL_COMMUNITY)
Admission: RE | Admit: 2012-02-18 | Discharge: 2012-02-18 | Disposition: A | Discharge status: Home / Self Care | Payer: 59 | Source: Ambulatory Visit | Attending: Family Medicine | Admitting: Family Medicine

## 2012-02-18 VITALS — BP 120/70 | HR 73 | Temp 98.4°F | Ht 65.0 in | Wt 228.4 lb

## 2012-02-18 DIAGNOSIS — Z1239 Encounter for other screening for malignant neoplasm of breast: Secondary | ICD-10-CM

## 2012-02-18 DIAGNOSIS — F329 Major depressive disorder, single episode, unspecified: Secondary | ICD-10-CM

## 2012-02-18 DIAGNOSIS — N951 Menopausal and female climacteric states: Secondary | ICD-10-CM

## 2012-02-18 DIAGNOSIS — Z780 Asymptomatic menopausal state: Secondary | ICD-10-CM

## 2012-02-18 DIAGNOSIS — D239 Other benign neoplasm of skin, unspecified: Secondary | ICD-10-CM

## 2012-02-18 DIAGNOSIS — I10 Essential (primary) hypertension: Secondary | ICD-10-CM

## 2012-02-18 DIAGNOSIS — Z124 Encounter for screening for malignant neoplasm of cervix: Secondary | ICD-10-CM

## 2012-02-18 DIAGNOSIS — Z01419 Encounter for gynecological examination (general) (routine) without abnormal findings: Secondary | ICD-10-CM | POA: Insufficient documentation

## 2012-02-18 DIAGNOSIS — D229 Melanocytic nevi, unspecified: Secondary | ICD-10-CM

## 2012-02-18 DIAGNOSIS — E039 Hypothyroidism, unspecified: Secondary | ICD-10-CM

## 2012-02-18 DIAGNOSIS — Z0000 Encounter for general adult medical examination without abnormal findings: Secondary | ICD-10-CM

## 2012-02-18 LAB — HEPATIC FUNCTION PANEL
ALT: 21 U/L (ref 0–35)
AST: 20 U/L (ref 0–37)
Total Bilirubin: 0.4 mg/dL (ref 0.3–1.2)
Total Protein: 7.5 g/dL (ref 6.0–8.3)

## 2012-02-18 LAB — CBC WITH DIFFERENTIAL/PLATELET
Basophils Relative: 0.6 % (ref 0.0–3.0)
Eosinophils Relative: 2.4 % (ref 0.0–5.0)
HCT: 40.3 % (ref 36.0–46.0)
Hemoglobin: 13.4 g/dL (ref 12.0–15.0)
Lymphs Abs: 1.3 10*3/uL (ref 0.7–4.0)
MCV: 91 fl (ref 78.0–100.0)
Monocytes Absolute: 0.4 10*3/uL (ref 0.1–1.0)
Monocytes Relative: 6.5 % (ref 3.0–12.0)
Neutro Abs: 3.8 10*3/uL (ref 1.4–7.7)
Platelets: 319 10*3/uL (ref 150.0–400.0)
RBC: 4.43 Mil/uL (ref 3.87–5.11)
WBC: 5.6 10*3/uL (ref 4.5–10.5)

## 2012-02-18 LAB — POCT URINALYSIS DIPSTICK
Bilirubin, UA: NEGATIVE
Glucose, UA: NEGATIVE
Nitrite, UA: NEGATIVE
Spec Grav, UA: 1.025
Urobilinogen, UA: 0.2

## 2012-02-18 LAB — LIPID PANEL
Cholesterol: 198 mg/dL (ref 0–200)
LDL Cholesterol: 130 mg/dL — ABNORMAL HIGH (ref 0–99)
Triglycerides: 124 mg/dL (ref 0.0–149.0)

## 2012-02-18 LAB — BASIC METABOLIC PANEL
BUN: 12 mg/dL (ref 6–23)
Chloride: 109 mEq/L (ref 96–112)
GFR: 89.08 mL/min (ref >60.00)
Potassium: 4 mEq/L (ref 3.5–5.1)
Sodium: 141 mEq/L (ref 135–145)

## 2012-02-18 LAB — TSH: TSH: 2.67 u[IU]/mL (ref 0.35–5.50)

## 2012-02-18 MED ORDER — VENLAFAXINE HCL ER 75 MG PO CP24: 75.0000 mg | ORAL_CAPSULE | Freq: Every day | ORAL | Status: DC

## 2012-02-18 MED ORDER — LISINOPRIL 20 MG PO TABS: 20.0000 mg | ORAL_TABLET | Freq: Every day | ORAL | Status: DC

## 2012-02-18 MED ORDER — LEVOTHYROXINE SODIUM 137 MCG PO TABS: 137.0000 ug | ORAL_TABLET | Freq: Every day | ORAL | Status: DC

## 2012-02-18 MED ORDER — ESTRADIOL 0.1 MG/GM VA CREA: 2.0000 g | TOPICAL_CREAM | Freq: Every day | VAGINAL | Status: DC

## 2012-02-18 NOTE — Assessment & Plan Note (Signed)
Check labs con't meds 

## 2012-02-18 NOTE — Assessment & Plan Note (Signed)
Stable con't meds 

## 2012-02-18 NOTE — Progress Notes (Signed)
Subjective:     Yanis Juma is a 51 y.o. female and is here for a comprehensive physical exam. The patient reports no problems.  History   Social History  . Marital Status: Divorced    Spouse Name: N/A    Number of Children: N/A  . Years of Education: N/A   Occupational History  . Not on file.   Social History Main Topics  . Smoking status: Never Smoker   . Smokeless tobacco: Never Used  . Alcohol Use: Yes  . Drug Use: No  . Sexually Active: Not Currently     single   Other Topics Concern  . Not on file   Social History Narrative  . No narrative on file   Health Maintenance  Topic Date Due  . Mammogram  05/29/2010  . Influenza Vaccine  08/12/2012  . Pap Smear  02/18/2015  . Colonoscopy  02/17/2017  . Tetanus/tdap  11/01/2019    The following portions of the patient's history were reviewed and updated as appropriate: allergies, current medications, past family history, past medical history, past social history, past surgical history and problem list.  Review of Systems Review of Systems  Constitutional: Negative for activity change, appetite change and fatigue.  HENT: Negative for hearing loss, congestion, tinnitus and ear discharge.  dentist q73m Eyes: Negative for visual disturbance (see optho q1y -- vision corrected to 20/20 with glasses).  Respiratory: Negative for cough, chest tightness and shortness of breath.   Cardiovascular: Negative for chest pain, palpitations and leg swelling.  Gastrointestinal: Negative for abdominal pain, diarrhea, constipation and abdominal distention.  Genitourinary: Negative for urgency, frequency, decreased urine volume and difficulty urinating.  Musculoskeletal: Negative for back pain, arthralgias and gait problem.  Skin: Negative for color change, pallor and rash.  Neurological: Negative for dizziness, light-headedness, numbness and headaches.  Hematological: Negative for adenopathy. Does not bruise/bleed easily.    Psychiatric/Behavioral: Negative for suicidal ideas, confusion, sleep disturbance, self-injury, dysphoric mood, decreased concentration and agitation.       Objective:    BP 120/70  Pulse 73  Temp(Src) 98.4 F (36.9 C) (Oral)  Ht 5\' 5"  (1.651 m)  Wt 228 lb 6.4 oz (103.602 kg)  BMI 38.01 kg/m2  SpO2 98% General appearance: alert, cooperative, appears stated age and no distress Head: Normocephalic, without obvious abnormality, atraumatic Eyes: conjunctivae/corneas clear. PERRL, EOM's intact. Fundi benign. Ears: normal TM's and external ear canals both ears Nose: Nares normal. Septum midline. Mucosa normal. No drainage or sinus tenderness. Throat: lips, mucosa, and tongue normal; teeth and gums normal Neck: no adenopathy, no carotid bruit, no JVD, supple, symmetrical, trachea midline and thyroid not enlarged, symmetric, no tenderness/mass/nodules Back: symmetric, no curvature. ROM normal. No CVA tenderness. Lungs: clear to auscultation bilaterally Breasts: normal appearance, no masses or tenderness Heart: regular rate and rhythm, S1, S2 normal, no murmur, click, rub or gallop Abdomen: soft, non-tender; bowel sounds normal; no masses,  no organomegaly Pelvic: cervix normal in appearance, external genitalia normal, no adnexal masses or tenderness, no cervical motion tenderness, rectovaginal septum normal, uterus normal size, shape, and consistency and vagina normal without discharge Extremities: extremities normal, atraumatic, no cyanosis or edema Pulses: 2+ and symmetric Skin: Skin color, texture, turgor normal. No rashes or lesions-- mult moles,  sk Lymph nodes: Cervical, supraclavicular, and axillary nodes normal. Neurologic: Alert and oriented X 3, normal strength and tone. Normal symmetric reflexes. Normal coordination and gait psych--  no depression or anxiety    Assessment:    Healthy female  exam.     depression  hypothyroidism Plan:    ghm utd Check labs See After  Visit Summary for Counseling Recommendations

## 2012-02-18 NOTE — Assessment & Plan Note (Signed)
stable °

## 2012-02-18 NOTE — Patient Instructions (Signed)
Preventive Care for Adults, Female A healthy lifestyle and preventive care can promote health and wellness. Preventive health guidelines for women include the following key practices.  A routine yearly physical is a good way to check with your caregiver about your health and preventive screening. It is a chance to share any concerns and updates on your health, and to receive a thorough exam.   Visit your dentist for a routine exam and preventive care every 6 months. Brush your teeth twice a day and floss once a day. Good oral hygiene prevents tooth decay and gum disease.   The frequency of eye exams is based on your age, health, family medical history, use of contact lenses, and other factors. Follow your caregiver's recommendations for frequency of eye exams.   Eat a healthy diet. Foods like vegetables, fruits, whole grains, low-fat dairy products, and lean protein foods contain the nutrients you need without too many calories. Decrease your intake of foods high in solid fats, added sugars, and salt. Eat the right amount of calories for you.Get information about a proper diet from your caregiver, if necessary.   Regular physical exercise is one of the most important things you can do for your health. Most adults should get at least 150 minutes of moderate-intensity exercise (any activity that increases your heart rate and causes you to sweat) each week. In addition, most adults need muscle-strengthening exercises on 2 or more days a week.   Maintain a healthy weight. The body mass index (BMI) is a screening tool to identify possible weight problems. It provides an estimate of body fat based on height and weight. Your caregiver can help determine your BMI, and can help you achieve or maintain a healthy weight.For adults 20 years and older:   A BMI below 18.5 is considered underweight.   A BMI of 18.5 to 24.9 is normal.   A BMI of 25 to 29.9 is considered overweight.   A BMI of 30 and above is  considered obese.   Maintain normal blood lipids and cholesterol levels by exercising and minimizing your intake of saturated fat. Eat a balanced diet with plenty of fruit and vegetables. Blood tests for lipids and cholesterol should begin at age 20 and be repeated every 5 years. If your lipid or cholesterol levels are high, you are over 50, or you are at high risk for heart disease, you may need your cholesterol levels checked more frequently.Ongoing high lipid and cholesterol levels should be treated with medicines if diet and exercise are not effective.   If you smoke, find out from your caregiver how to quit. If you do not use tobacco, do not start.   If you are pregnant, do not drink alcohol. If you are breastfeeding, be very cautious about drinking alcohol. If you are not pregnant and choose to drink alcohol, do not exceed 1 drink per day. One drink is considered to be 12 ounces (355 mL) of beer, 5 ounces (148 mL) of wine, or 1.5 ounces (44 mL) of liquor.   Avoid use of street drugs. Do not share needles with anyone. Ask for help if you need support or instructions about stopping the use of drugs.   High blood pressure causes heart disease and increases the risk of stroke. Your blood pressure should be checked at least every 1 to 2 years. Ongoing high blood pressure should be treated with medicines if weight loss and exercise are not effective.   If you are 55 to 52   years old, ask your caregiver if you should take aspirin to prevent strokes.   Diabetes screening involves taking a blood sample to check your fasting blood sugar level. This should be done once every 3 years, after age 45, if you are within normal weight and without risk factors for diabetes. Testing should be considered at a younger age or be carried out more frequently if you are overweight and have at least 1 risk factor for diabetes.   Breast cancer screening is essential preventive care for women. You should practice "breast  self-awareness." This means understanding the normal appearance and feel of your breasts and may include breast self-examination. Any changes detected, no matter how small, should be reported to a caregiver. Women in their 20s and 30s should have a clinical breast exam (CBE) by a caregiver as part of a regular health exam every 1 to 3 years. After age 40, women should have a CBE every year. Starting at age 40, women should consider having a mammography (breast X-ray test) every year. Women who have a family history of breast cancer should talk to their caregiver about genetic screening. Women at a high risk of breast cancer should talk to their caregivers about having magnetic resonance imaging (MRI) and a mammography every year.   The Pap test is a screening test for cervical cancer. A Pap test can show cell changes on the cervix that might become cervical cancer if left untreated. A Pap test is a procedure in which cells are obtained and examined from the lower end of the uterus (cervix).   Women should have a Pap test starting at age 21.   Between ages 21 and 29, Pap tests should be repeated every 2 years.   Beginning at age 30, you should have a Pap test every 3 years as long as the past 3 Pap tests have been normal.   Some women have medical problems that increase the chance of getting cervical cancer. Talk to your caregiver about these problems. It is especially important to talk to your caregiver if a new problem develops soon after your last Pap test. In these cases, your caregiver may recommend more frequent screening and Pap tests.   The above recommendations are the same for women who have or have not gotten the vaccine for human papillomavirus (HPV).   If you had a hysterectomy for a problem that was not cancer or a condition that could lead to cancer, then you no longer need Pap tests. Even if you no longer need a Pap test, a regular exam is a good idea to make sure no other problems are  starting.   If you are between ages 65 and 70, and you have had normal Pap tests going back 10 years, you no longer need Pap tests. Even if you no longer need a Pap test, a regular exam is a good idea to make sure no other problems are starting.   If you have had past treatment for cervical cancer or a condition that could lead to cancer, you need Pap tests and screening for cancer for at least 20 years after your treatment.   If Pap tests have been discontinued, risk factors (such as a new sexual partner) need to be reassessed to determine if screening should be resumed.   The HPV test is an additional test that may be used for cervical cancer screening. The HPV test looks for the virus that can cause the cell changes on the cervix.   The cells collected during the Pap test can be tested for HPV. The HPV test could be used to screen women aged 30 years and older, and should be used in women of any age who have unclear Pap test results. After the age of 30, women should have HPV testing at the same frequency as a Pap test.   Colorectal cancer can be detected and often prevented. Most routine colorectal cancer screening begins at the age of 50 and continues through age 75. However, your caregiver may recommend screening at an earlier age if you have risk factors for colon cancer. On a yearly basis, your caregiver may provide home test kits to check for hidden blood in the stool. Use of a small camera at the end of a tube, to directly examine the colon (sigmoidoscopy or colonoscopy), can detect the earliest forms of colorectal cancer. Talk to your caregiver about this at age 50, when routine screening begins. Direct examination of the colon should be repeated every 5 to 10 years through age 75, unless early forms of pre-cancerous polyps or small growths are found.   Hepatitis C blood testing is recommended for all people born from 1945 through 1965 and any individual with known risks for hepatitis C.    Practice safe sex. Use condoms and avoid high-risk sexual practices to reduce the spread of sexually transmitted infections (STIs). STIs include gonorrhea, chlamydia, syphilis, trichomonas, herpes, HPV, and human immunodeficiency virus (HIV). Herpes, HIV, and HPV are viral illnesses that have no cure. They can result in disability, cancer, and death. Sexually active women aged 25 and younger should be checked for chlamydia. Older women with new or multiple partners should also be tested for chlamydia. Testing for other STIs is recommended if you are sexually active and at increased risk.   Osteoporosis is a disease in which the bones lose minerals and strength with aging. This can result in serious bone fractures. The risk of osteoporosis can be identified using a bone density scan. Women ages 65 and over and women at risk for fractures or osteoporosis should discuss screening with their caregivers. Ask your caregiver whether you should take a calcium supplement or vitamin D to reduce the rate of osteoporosis.   Menopause can be associated with physical symptoms and risks. Hormone replacement therapy is available to decrease symptoms and risks. You should talk to your caregiver about whether hormone replacement therapy is right for you.   Use sunscreen with sun protection factor (SPF) of 30 or more. Apply sunscreen liberally and repeatedly throughout the day. You should seek shade when your shadow is shorter than you. Protect yourself by wearing long sleeves, pants, a wide-brimmed hat, and sunglasses year round, whenever you are outdoors.   Once a month, do a whole body skin exam, using a mirror to look at the skin on your back. Notify your caregiver of new moles, moles that have irregular borders, moles that are larger than a pencil eraser, or moles that have changed in shape or color.   Stay current with required immunizations.   Influenza. You need a dose every fall (or winter). The composition of  the flu vaccine changes each year, so being vaccinated once is not enough.   Pneumococcal polysaccharide. You need 1 to 2 doses if you smoke cigarettes or if you have certain chronic medical conditions. You need 1 dose at age 65 (or older) if you have never been vaccinated.   Tetanus, diphtheria, pertussis (Tdap, Td). Get 1 dose of   Tdap vaccine if you are younger than age 65, are over 65 and have contact with an infant, are a healthcare worker, are pregnant, or simply want to be protected from whooping cough. After that, you need a Td booster dose every 10 years. Consult your caregiver if you have not had at least 3 tetanus and diphtheria-containing shots sometime in your life or have a deep or dirty wound.   HPV. You need this vaccine if you are a woman age 26 or younger. The vaccine is given in 3 doses over 6 months.   Measles, mumps, rubella (MMR). You need at least 1 dose of MMR if you were born in 1957 or later. You may also need a second dose.   Meningococcal. If you are age 19 to 21 and a first-year college student living in a residence hall, or have one of several medical conditions, you need to get vaccinated against meningococcal disease. You may also need additional booster doses.   Zoster (shingles). If you are age 60 or older, you should get this vaccine.   Varicella (chickenpox). If you have never had chickenpox or you were vaccinated but received only 1 dose, talk to your caregiver to find out if you need this vaccine.   Hepatitis A. You need this vaccine if you have a specific risk factor for hepatitis A virus infection or you simply wish to be protected from this disease. The vaccine is usually given as 2 doses, 6 to 18 months apart.   Hepatitis B. You need this vaccine if you have a specific risk factor for hepatitis B virus infection or you simply wish to be protected from this disease. The vaccine is given in 3 doses, usually over 6 months.  Preventive Services /  Frequency Ages 19 to 39  Blood pressure check.** / Every 1 to 2 years.   Lipid and cholesterol check.** / Every 5 years beginning at age 20.   Clinical breast exam.** / Every 3 years for women in their 20s and 30s.   Pap test.** / Every 2 years from ages 21 through 29. Every 3 years starting at age 30 through age 65 or 70 with a history of 3 consecutive normal Pap tests.   HPV screening.** / Every 3 years from ages 30 through ages 65 to 70 with a history of 3 consecutive normal Pap tests.   Hepatitis C blood test.** / For any individual with known risks for hepatitis C.   Skin self-exam. / Monthly.   Influenza immunization.** / Every year.   Pneumococcal polysaccharide immunization.** / 1 to 2 doses if you smoke cigarettes or if you have certain chronic medical conditions.   Tetanus, diphtheria, pertussis (Tdap, Td) immunization. / A one-time dose of Tdap vaccine. After that, you need a Td booster dose every 10 years.   HPV immunization. / 3 doses over 6 months, if you are 26 and younger.   Measles, mumps, rubella (MMR) immunization. / You need at least 1 dose of MMR if you were born in 1957 or later. You may also need a second dose.   Meningococcal immunization. / 1 dose if you are age 19 to 21 and a first-year college student living in a residence hall, or have one of several medical conditions, you need to get vaccinated against meningococcal disease. You may also need additional booster doses.   Varicella immunization.** / Consult your caregiver.   Hepatitis A immunization.** / Consult your caregiver. 2 doses, 6 to 18 months   apart.   Hepatitis B immunization.** / Consult your caregiver. 3 doses usually over 6 months.  Ages 40 to 64  Blood pressure check.** / Every 1 to 2 years.   Lipid and cholesterol check.** / Every 5 years beginning at age 20.   Clinical breast exam.** / Every year after age 40.   Mammogram.** / Every year beginning at age 40 and continuing for as  long as you are in good health. Consult with your caregiver.   Pap test.** / Every 3 years starting at age 30 through age 65 or 70 with a history of 3 consecutive normal Pap tests.   HPV screening.** / Every 3 years from ages 30 through ages 65 to 70 with a history of 3 consecutive normal Pap tests.   Fecal occult blood test (FOBT) of stool. / Every year beginning at age 50 and continuing until age 75. You may not need to do this test if you get a colonoscopy every 10 years.   Flexible sigmoidoscopy or colonoscopy.** / Every 5 years for a flexible sigmoidoscopy or every 10 years for a colonoscopy beginning at age 50 and continuing until age 75.   Hepatitis C blood test.** / For all people born from 1945 through 1965 and any individual with known risks for hepatitis C.   Skin self-exam. / Monthly.   Influenza immunization.** / Every year.   Pneumococcal polysaccharide immunization.** / 1 to 2 doses if you smoke cigarettes or if you have certain chronic medical conditions.   Tetanus, diphtheria, pertussis (Tdap, Td) immunization.** / A one-time dose of Tdap vaccine. After that, you need a Td booster dose every 10 years.   Measles, mumps, rubella (MMR) immunization. / You need at least 1 dose of MMR if you were born in 1957 or later. You may also need a second dose.   Varicella immunization.** / Consult your caregiver.   Meningococcal immunization.** / Consult your caregiver.   Hepatitis A immunization.** / Consult your caregiver. 2 doses, 6 to 18 months apart.   Hepatitis B immunization.** / Consult your caregiver. 3 doses, usually over 6 months.  Ages 65 and over  Blood pressure check.** / Every 1 to 2 years.   Lipid and cholesterol check.** / Every 5 years beginning at age 20.   Clinical breast exam.** / Every year after age 40.   Mammogram.** / Every year beginning at age 40 and continuing for as long as you are in good health. Consult with your caregiver.   Pap test.** /  Every 3 years starting at age 30 through age 65 or 70 with a 3 consecutive normal Pap tests. Testing can be stopped between 65 and 70 with 3 consecutive normal Pap tests and no abnormal Pap or HPV tests in the past 10 years.   HPV screening.** / Every 3 years from ages 30 through ages 65 or 70 with a history of 3 consecutive normal Pap tests. Testing can be stopped between 65 and 70 with 3 consecutive normal Pap tests and no abnormal Pap or HPV tests in the past 10 years.   Fecal occult blood test (FOBT) of stool. / Every year beginning at age 50 and continuing until age 75. You may not need to do this test if you get a colonoscopy every 10 years.   Flexible sigmoidoscopy or colonoscopy.** / Every 5 years for a flexible sigmoidoscopy or every 10 years for a colonoscopy beginning at age 50 and continuing until age 75.   Hepatitis   C blood test.** / For all people born from 1945 through 1965 and any individual with known risks for hepatitis C.   Osteoporosis screening.** / A one-time screening for women ages 65 and over and women at risk for fractures or osteoporosis.   Skin self-exam. / Monthly.   Influenza immunization.** / Every year.   Pneumococcal polysaccharide immunization.** / 1 dose at age 65 (or older) if you have never been vaccinated.   Tetanus, diphtheria, pertussis (Tdap, Td) immunization. / A one-time dose of Tdap vaccine if you are over 65 and have contact with an infant, are a healthcare worker, or simply want to be protected from whooping cough. After that, you need a Td booster dose every 10 years.   Varicella immunization.** / Consult your caregiver.   Meningococcal immunization.** / Consult your caregiver.   Hepatitis A immunization.** / Consult your caregiver. 2 doses, 6 to 18 months apart.   Hepatitis B immunization.** / Check with your caregiver. 3 doses, usually over 6 months.  ** Family history and personal history of risk and conditions may change your caregiver's  recommendations. Document Released: 12/25/2001 Document Revised: 10/18/2011 Document Reviewed: 03/26/2011 [employer] Patient Information 2012 [employer]. 

## 2012-03-01 ENCOUNTER — Other Ambulatory Visit: Payer: Self-pay | Admitting: Family Medicine

## 2012-03-08 ENCOUNTER — Other Ambulatory Visit: Payer: Self-pay | Admitting: Family Medicine

## 2012-03-21 ENCOUNTER — Ambulatory Visit (INDEPENDENT_AMBULATORY_CARE_PROVIDER_SITE_OTHER): Payer: 59 | Admitting: Family Medicine

## 2012-03-21 ENCOUNTER — Encounter: Payer: Self-pay | Admitting: Family Medicine

## 2012-03-21 VITALS — BP 118/80 | HR 70 | Temp 98.6°F | Wt 233.0 lb

## 2012-03-21 DIAGNOSIS — N390 Urinary tract infection, site not specified: Secondary | ICD-10-CM

## 2012-03-21 LAB — POCT URINALYSIS DIPSTICK
Bilirubin, UA: NEGATIVE
Ketones, UA: NEGATIVE
Protein, UA: NEGATIVE
Spec Grav, UA: 1.015
pH, UA: 6

## 2012-03-21 MED ORDER — LEVOFLOXACIN 500 MG PO TABS: 500.0000 mg | ORAL_TABLET | Freq: Every day | ORAL | Status: AC

## 2012-03-21 NOTE — Patient Instructions (Signed)

## 2012-03-21 NOTE — Progress Notes (Signed)
  Subjective:    Patient ID: Teresa Nixon, female    DOB: [birth year], 51 y.o.   MRN: [id number]  HPI Pt here c/o urinary burning and urgency.    No other complaints.     Review of Systems As above    Objective:   Physical Exam  Constitutional: She is oriented to person, place, and time. She appears well-developed and well-nourished.  Pulmonary/Chest: Effort normal and breath sounds normal.  Abdominal: Soft. Bowel sounds are normal.  Neurological: She is alert and oriented to person, place, and time.  Skin: Skin is warm and dry.  Psychiatric: She has a normal mood and affect. Her behavior is normal. Judgment and thought content normal.          Assessment & Plan:  Dysuria---check culture---inc estrogen cream to daily                     rx levaquin given to pt                     Wait for culture                     Consider urology if no better

## 2012-03-24 ENCOUNTER — Encounter: Payer: 59 | Admitting: Family Medicine

## 2012-03-24 LAB — URINE CULTURE

## 2012-05-28 ENCOUNTER — Telehealth: Payer: Self-pay | Admitting: *Deleted

## 2012-05-28 DIAGNOSIS — F329 Major depressive disorder, single episode, unspecified: Secondary | ICD-10-CM

## 2012-05-28 NOTE — Telephone Encounter (Signed)
Increase to 150 mg  #30  2 refills---- ov 4-6 weeks

## 2012-05-28 NOTE — Telephone Encounter (Signed)
Pt states that she left VM on yesterday and was calling to f/u. Pt would like to know if it would be ok to increase dose of Effexor or would a OV be needed before change can be made. Pt notes that she had been on a higher dose in the past. Please advise   Message left at 5:02 pm on triage VM

## 2012-05-29 NOTE — Telephone Encounter (Signed)
msg left to call the office     KP 

## 2012-05-30 NOTE — Telephone Encounter (Signed)
msg left to call the office [phone] (Home)  

## 2012-06-03 NOTE — Telephone Encounter (Signed)
msg left to call the office [phone] [facility])

## 2012-06-04 MED ORDER — VENLAFAXINE HCL ER 150 MG PO CP24: 150.0000 mg | ORAL_CAPSULE | Freq: Every day | ORAL | Status: DC

## 2012-06-04 NOTE — Telephone Encounter (Signed)
Unable to contact the patient--- Rx faxed with follow up direction.     KP

## 2012-08-12 ENCOUNTER — Other Ambulatory Visit: Payer: Self-pay | Admitting: Family Medicine

## 2012-08-28 ENCOUNTER — Other Ambulatory Visit: Payer: Self-pay | Admitting: Family Medicine

## 2012-09-26 ENCOUNTER — Telehealth: Payer: Self-pay | Admitting: Family Medicine

## 2012-09-26 NOTE — Telephone Encounter (Signed)
Patient Information:  Caller Name: [employer]  Phone: [phone]  Patient: Teresa Nixon  Gender: Female  DOB: 1961/05/18  Age: 50 Years  PCP: Lelon Perla.  Pregnant: No   Symptoms  Reason For Call & Symptoms: wakes with a h/a x 10 days with elevated b/p  Reviewed Health History In EMR: Yes  Reviewed Medications In EMR: Yes  Reviewed Allergies In EMR: Yes  Date of Onset of Symptoms: 09/15/2012 OB:  LMP: Unknown  Guideline(s) Used:  Headache  Headache  Disposition Per Guideline:   See Today or Tomorrow in Office  Reason For Disposition Reached:   New headache and age > 32  Advice Given:  N/A  Office Follow Up:  Does the office need to follow up with this patient?: No  Instructions For The Office: N/A  RN Note:  Has tingling in her hands, tips of fingers and lips at the end of each day when her b/p is elevated the most.

## 2012-09-26 NOTE — Telephone Encounter (Signed)
Was appointment made? 

## 2012-09-26 NOTE — Telephone Encounter (Signed)
09/27/12 @ 10 at the [place] office.    KP

## 2012-09-27 ENCOUNTER — Encounter: Payer: Self-pay | Admitting: Family Medicine

## 2012-09-27 ENCOUNTER — Ambulatory Visit (INDEPENDENT_AMBULATORY_CARE_PROVIDER_SITE_OTHER): Payer: 59 | Admitting: Family Medicine

## 2012-09-27 VITALS — BP 136/78 | HR 90 | Wt 226.0 lb

## 2012-09-27 DIAGNOSIS — Z9884 Bariatric surgery status: Secondary | ICD-10-CM

## 2012-09-27 DIAGNOSIS — I10 Essential (primary) hypertension: Secondary | ICD-10-CM

## 2012-09-27 DIAGNOSIS — R209 Unspecified disturbances of skin sensation: Secondary | ICD-10-CM

## 2012-09-27 DIAGNOSIS — R202 Paresthesia of skin: Secondary | ICD-10-CM | POA: Insufficient documentation

## 2012-09-27 DIAGNOSIS — Z1239 Encounter for other screening for malignant neoplasm of breast: Secondary | ICD-10-CM

## 2012-09-27 NOTE — Progress Notes (Signed)
OFFICE NOTE  09/27/2012  CC:  Chief Complaint  Patient presents with  . Headache    tingling in lips, fingers and toes  . Hypertension     HPI: Patient is a 51 y.o. Caucasian female who is here for HA, tingling in lips, fingers, toes.   Notes past hx of tingling like this intermittently and it has usually been when she was out of synthroid or lisinopril. However, lately this tingling has been more persistent/constant--for a couple of weeks now.  HA low grade and behind eyes.  No blurred vision or photophobia or other vision complaints.  No hearing complaint.  Denies focal or generalized weakness.  No myalgias or arthralgias.  No rash. Home BP checks 140s/90s.  Takes one sublingual B12 pill per week. Denies recent URI/sinus issues.  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 No vag bleeding or urinary or GI bleeding noted.  Pertinent PMH:  Past Medical History  Diagnosis Date  . Hypertension   . Depression   . Thyroid disease     hypothyroid   Past Surgical History  Procedure Date  . Gastric bypass 2001  . Breast enhancement surgery 10/2003  . 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 levothyroxine (SYNTHROID, LEVOTHROID) 137 MCG tablet TAKE 1 TABLET  BY MOUTH DAILY.  30 tablet  11  . venlafaxine XR (EFFEXOR-XR) 150 MG 24 hr capsule TAKE 1 CAPSULE (150 MG TOTAL) BY MOUTH DAILY.  30 capsule  1  . cholecalciferol (VITAMIN D) 1000 UNITS tablet Take 1,000 Units by mouth daily.        Marland Kitchen conjugated estrogens (PREMARIN) vaginal cream 1g pv qd for 1 week then decrease to 3x a week  42.5 g  12  . Cranberry 1000 MG CAPS Take 2 capsules by mouth once a week.       . Cyanocobalamin (VITAMIN B 12 PO) Take 1,000 mcg by mouth once a week.        . estradiol (ESTRACE VAGINAL) 0.1 MG/GM vaginal cream Place 0.25 Applicatorfuls vaginally daily.  42.5 g  5  . fluticasone (VERAMYST) 27.5 MCG/SPRAY  nasal spray 2 sprays by Nasal route daily.        Marland Kitchen lisinopril (PRINIVIL,ZESTRIL) 20 MG tablet TAKE 1 TABLET (20 MG TOTAL) BY MOUTH DAILY.  30 tablet  4  . loratadine (CLARITIN) 10 MG tablet Take 10 mg by mouth daily.        . Multiple Vitamin (MULTIVITAMIN) tablet Take 1 tablet by mouth daily.      . [DISCONTINUED] venlafaxine (EFFEXOR-XR) 75 MG 24 hr capsule Take 1 capsule (75 mg total) by mouth daily.  30 capsule  11   Last reviewed on 09/27/2012 10:35 AM by Jeoffrey Massed, MD  PE: Blood pressure 136/78, pulse 90, weight 226 lb (102.513 kg), SpO2 97.00%. Gen: Alert, well appearing.  Patient is oriented to person, place, time, and situation. AFFECT: pleasant, lucid thought and speech. ENT: Ears: EACs clear, normal epithelium.  TMs with good light reflex and landmarks bilaterally.  Eyes: no injection, icteris, swelling, or exudate.  EOMI, PERRLA. Nose: no drainage or turbinate edema/swelling.  No injection or focal lesion.  Mouth: lips without lesion/swelling.  Oral mucosa pink and moist.  Dentition intact and without obvious caries or gingival swelling.  Oropharynx without erythema, exudate, or swelling.  Neck: supple/nontender.  No LAD, mass, or TM.  Carotid pulses 2+ bilaterally, without bruits. CV: RRR,  no m/r/g.   LUNGS: CTA bilat, nonlabored resps, good aeration in all lung fields. ABD: soft, NT, ND, BS normal.  No hepatospenomegaly or mass.  No bruits. EXT: no clubbing, cyanosis, or edema.  Musculoskeletal: no joint swelling, erythema, warmth, or tenderness.  ROM of all joints intact. Skin - no sores or suspicious lesions or rashes or color changes Neuro: CN 2-12 intact bilaterally, strength 5/5 in proximal and distal upper extremities and lower extremities bilaterally.  No sensory deficits.  No tremor.  No disdiadochokinesis.  No ataxia.  Upper extremity and lower extremity DTRs symmetric.  No pronator drift.  LAB today: none   IMPRESSION AND PLAN:  Paresthesias These had been  intermittent but are now constant and associated with mild retro-orbital HA. Will do some blood work to r/o metabolic cause: check CBC, CMET, Mg, phos, vitamin B12 level, and TSH. If all of this testing is normal, I think she may need further eval with neurologist or get MRI brain for further evaluation.   An After Visit Summary was printed and given to the patient.  FOLLOW UP: 1-2 wks with PMD.

## 2012-09-27 NOTE — Assessment & Plan Note (Addendum)
These had been intermittent but are now constant and associated with mild retro-orbital HA. Will do some blood work to r/o metabolic cause: check CBC, CMET, Mg, phos, vitamin B12 level, and TSH. If all of this testing is normal, I think she may need further eval with neurologist or get MRI brain for further evaluation.

## 2012-09-29 ENCOUNTER — Encounter: Payer: Self-pay | Admitting: Family Medicine

## 2012-09-30 ENCOUNTER — Encounter: Payer: Self-pay | Admitting: *Deleted

## 2012-10-12 ENCOUNTER — Other Ambulatory Visit: Payer: Self-pay | Admitting: Family Medicine

## 2012-11-11 ENCOUNTER — Encounter: Payer: Self-pay | Admitting: Family Medicine

## 2012-11-11 ENCOUNTER — Telehealth: Payer: Self-pay | Admitting: *Deleted

## 2012-11-11 NOTE — Telephone Encounter (Signed)
Pt left VM that she was calling to inquire about test result from 09-27-12. Left message to call office to advise Pt that per records no labs were done on this day. Labs are still pending to be collected.

## 2012-11-17 NOTE — Telephone Encounter (Signed)
Please advise mychart

## 2012-12-09 ENCOUNTER — Other Ambulatory Visit: Payer: Self-pay | Admitting: Family Medicine

## 2012-12-31 ENCOUNTER — Telehealth: Payer: Self-pay

## 2012-12-31 NOTE — Telephone Encounter (Signed)
Labs done on 09/27/12 CBC-D was Normal,  CMP was Normal , Phosphorus was Normal, Magnesium was normal, TSH was normal, Vitamin B-12 was Normal and Vitamin-D was low at 25 and Dr.Lowne is recommending the patient take Vit D 50,000 units weekly #4 with 2 refills recheck Vitamin D in 1 month. Copy sent to be scanned.     KP   Msg left to call the office.      KP

## 2013-01-02 MED ORDER — ERGOCALCIFEROL 1.25 MG (50000 UT) PO CAPS: 50000.0000 [IU] | ORAL_CAPSULE | ORAL | Status: DC

## 2013-01-02 NOTE — Telephone Encounter (Signed)
My-chart message sent      KP 

## 2013-01-09 ENCOUNTER — Other Ambulatory Visit: Payer: Self-pay | Admitting: Family Medicine

## 2013-01-09 ENCOUNTER — Encounter: Payer: Self-pay | Admitting: Family Medicine

## 2013-01-09 ENCOUNTER — Ambulatory Visit (INDEPENDENT_AMBULATORY_CARE_PROVIDER_SITE_OTHER): Payer: 59 | Admitting: Family Medicine

## 2013-01-09 VITALS — BP 130/78 | HR 87 | Temp 97.6°F | Wt 226.4 lb

## 2013-01-09 DIAGNOSIS — E039 Hypothyroidism, unspecified: Secondary | ICD-10-CM

## 2013-01-09 DIAGNOSIS — F329 Major depressive disorder, single episode, unspecified: Secondary | ICD-10-CM

## 2013-01-09 DIAGNOSIS — I10 Essential (primary) hypertension: Secondary | ICD-10-CM

## 2013-01-09 MED ORDER — VENLAFAXINE HCL ER 150 MG PO CP24: 150.0000 mg | ORAL_CAPSULE | Freq: Every day | ORAL | Status: DC

## 2013-01-09 NOTE — Patient Instructions (Signed)

## 2013-01-10 NOTE — Progress Notes (Signed)
  Subjective:    Patient ID: Teresa Nixon, female    DOB: [birth year], 52 y.o.   MRN: [id number]  HPI Pt here f/u depression.  She needs a refill on her meds.  No other complaints.     Review of Systems As above    Objective:   Physical Exam  BP 130/78  Pulse 87  Temp(Src) 97.6 F (36.4 C) (Oral)  Wt 226 lb 6.4 oz (102.694 kg)  BMI 37.67 kg/m2  SpO2 98% General appearance: alert, cooperative, appears stated age and no distress Nose: Nares normal. Septum midline. Mucosa normal. No drainage or sinus tenderness. Neck: no adenopathy, no carotid bruit, no JVD, supple, symmetrical, trachea midline and thyroid not enlarged, symmetric, no tenderness/mass/nodules Lungs: clear to auscultation bilaterally Heart: S1, S2 normal      Assessment & Plan:

## 2013-01-10 NOTE — Assessment & Plan Note (Signed)
Refill effexor. 

## 2013-01-10 NOTE — Assessment & Plan Note (Signed)
Stable con't meds 

## 2013-01-10 NOTE — Assessment & Plan Note (Signed)
stable °

## 2013-02-04 ENCOUNTER — Other Ambulatory Visit: Payer: Self-pay | Admitting: Family Medicine

## 2013-03-09 ENCOUNTER — Other Ambulatory Visit: Payer: Self-pay | Admitting: Family Medicine

## 2013-03-20 ENCOUNTER — Other Ambulatory Visit: Payer: Self-pay | Admitting: Family Medicine

## 2013-04-08 ENCOUNTER — Encounter: Payer: Self-pay | Admitting: Family Medicine

## 2013-04-08 ENCOUNTER — Ambulatory Visit (INDEPENDENT_AMBULATORY_CARE_PROVIDER_SITE_OTHER): Payer: 59 | Admitting: Family Medicine

## 2013-04-08 VITALS — BP 118/80 | HR 83 | Temp 98.5°F | Ht 65.0 in | Wt 226.8 lb

## 2013-04-08 DIAGNOSIS — Z1239 Encounter for other screening for malignant neoplasm of breast: Secondary | ICD-10-CM

## 2013-04-08 DIAGNOSIS — I10 Essential (primary) hypertension: Secondary | ICD-10-CM

## 2013-04-08 DIAGNOSIS — E039 Hypothyroidism, unspecified: Secondary | ICD-10-CM

## 2013-04-08 DIAGNOSIS — F329 Major depressive disorder, single episode, unspecified: Secondary | ICD-10-CM

## 2013-04-08 DIAGNOSIS — Z0000 Encounter for general adult medical examination without abnormal findings: Secondary | ICD-10-CM

## 2013-04-08 DIAGNOSIS — R319 Hematuria, unspecified: Secondary | ICD-10-CM

## 2013-04-08 DIAGNOSIS — Z780 Asymptomatic menopausal state: Secondary | ICD-10-CM

## 2013-04-08 LAB — LIPID PANEL
Cholesterol: 205 mg/dL — ABNORMAL HIGH (ref 0–200)
HDL: 54 mg/dL (ref >39.00)
Triglycerides: 152 mg/dL — ABNORMAL HIGH (ref 0.0–149.0)

## 2013-04-08 LAB — CBC WITH DIFFERENTIAL/PLATELET
Basophils Relative: 0.8 % (ref 0.0–3.0)
Eosinophils Absolute: 0.1 10*3/uL (ref 0.0–0.7)
Lymphs Abs: 1.2 10*3/uL (ref 0.7–4.0)
MCHC: 33.4 g/dL (ref 30.0–36.0)
MCV: 89 fl (ref 78.0–100.0)
Monocytes Absolute: 0.4 10*3/uL (ref 0.1–1.0)
Neutrophils Relative %: 72.1 % (ref 43.0–77.0)
Platelets: 344 10*3/uL (ref 150.0–400.0)

## 2013-04-08 LAB — POCT URINALYSIS DIPSTICK
Ketones, UA: NEGATIVE
Leukocytes, UA: NEGATIVE
Protein, UA: NEGATIVE
Spec Grav, UA: 1.02
pH, UA: 6

## 2013-04-08 LAB — BASIC METABOLIC PANEL
BUN: 11 mg/dL (ref 6–23)
CO2: 26 mEq/L (ref 19–32)
Chloride: 106 mEq/L (ref 96–112)
Creatinine, Ser: 0.8 mg/dL (ref 0.4–1.2)

## 2013-04-08 LAB — LDL CHOLESTEROL, DIRECT: Direct LDL: 133.6 mg/dL

## 2013-04-08 LAB — HEPATIC FUNCTION PANEL
Bilirubin, Direct: 0 mg/dL (ref 0.0–0.3)
Total Protein: 7.6 g/dL (ref 6.0–8.3)

## 2013-04-08 MED ORDER — VENLAFAXINE HCL ER 150 MG PO CP24
150.0000 mg | ORAL_CAPSULE | Freq: Every day | ORAL | Status: DC
Start: 2013-04-08 — End: 2014-04-13

## 2013-04-08 MED ORDER — ESTROGENS, CONJUGATED 0.625 MG/GM VA CREA: TOPICAL_CREAM | VAGINAL | Status: DC

## 2013-04-08 MED ORDER — AMLODIPINE BESYLATE 5 MG PO TABS: 5.0000 mg | ORAL_TABLET | Freq: Every day | ORAL | Status: DC

## 2013-04-08 NOTE — Assessment & Plan Note (Signed)
Check labs con't meds 

## 2013-04-08 NOTE — Progress Notes (Signed)
Subjective:     Teresa Nixon is a 52 y.o. female and is here for a comprehensive physical exam. The patient reports problems - inc depression --she is requesting to see a Veterinary surgeon.  History   Social History  . Marital Status: Divorced    Spouse Name: N/A    Number of Children: N/A  . Years of Education: N/A   Occupational History  . [employer]-- compliance officer    Social History Main Topics  . Smoking status: Never Smoker   . Smokeless tobacco: Never Used  . Alcohol Use: Yes  . Drug Use: No  . Sexually Active: Not Currently -- Female partner(s)     Comment: single   Other Topics Concern  . Not on file   Social History Narrative   Exercise--walks 4000 steps   Health Maintenance  Topic Date Due  . Mammogram  05/29/2010  . Influenza Vaccine  07/13/2013  . Pap Smear  02/18/2015  . Colonoscopy  02/17/2017  . Tetanus/tdap  11/01/2019    The following portions of the patient's history were reviewed and updated as appropriate:  She  has a past medical history of Hypertension; Depression; and Thyroid disease. She  does not have any pertinent problems on file. She  has past surgical history that includes Gastric bypass (2001); Breast reduction surgery (10/2003); and Abdominoplasty (03/2007). Her family history includes Breast cancer in her paternal aunt; Cancer in her father and paternal grandmother; Coronary artery disease in her paternal grandfather; Diabetes in her father; Hypertension in her mother; and Stroke in her father. She  reports that she has never smoked. She has never used smokeless tobacco. She reports that  drinks alcohol. She reports that she does not use illicit drugs. She has a current medication list which includes the following prescription(s): conjugated estrogens, cranberry, cyanocobalamin, levothyroxine, loratadine, multivitamin, venlafaxine xr, and amlodipine. Current Outpatient Prescriptions on File Prior to Visit  Medication Sig Dispense  Refill  . Cranberry 1000 MG CAPS Take 2 capsules by mouth once a week.       . Cyanocobalamin (VITAMIN B 12 PO) Take 1,000 mcg by mouth once a week.        . levothyroxine (SYNTHROID, LEVOTHROID) 137 MCG tablet TAKE 1 TABLET  BY MOUTH DAILY.  30 tablet  11  . loratadine (CLARITIN) 10 MG tablet Take 10 mg by mouth daily.        . Multiple Vitamin (MULTIVITAMIN) tablet Take 1 tablet by mouth daily.       No current facility-administered medications on file prior to visit.   She has No Known Allergies..  Review of Systems Review of Systems  Constitutional: Negative for activity change, appetite change and fatigue.  HENT: Negative for hearing loss, congestion, tinnitus and ear discharge.  dentist q65m Eyes: Negative for visual disturbance (see optho q1y -- vision corrected to 20/20 with glasses).  Respiratory: Negative for cough, chest tightness and shortness of breath.   Cardiovascular: Negative for chest pain, palpitations and leg swelling.  Gastrointestinal: Negative for abdominal pain, diarrhea, constipation and abdominal distention.  Genitourinary: Negative for urgency, frequency, decreased urine volume and difficulty urinating.  Musculoskeletal: Negative for back pain, arthralgias and gait problem.  Skin: Negative for color change, pallor and rash.  Neurological: Negative for dizziness, light-headedness, numbness and headaches.  Hematological: Negative for adenopathy. Does not bruise/bleed easily.  Psychiatric/Behavioral: Negative for suicidal ideas, confusion, sleep disturbance, self-injury, dysphoric mood, decreased concentration and agitation.       Objective:  BP 118/80  Pulse 83  Temp(Src) 98.5 F (36.9 C) (Oral)  Ht 5\' 5"  (1.651 m)  Wt 226 lb 12.8 oz (102.876 kg)  BMI 37.74 kg/m2  SpO2 97% General appearance: alert, cooperative, appears stated age and no distress Head: Normocephalic, without obvious abnormality, atraumatic Eyes: conjunctivae/corneas clear. PERRL,  EOM's intact. Fundi benign. Ears: normal TM's and external ear canals both ears Nose: Nares normal. Septum midline. Mucosa normal. No drainage or sinus tenderness. Throat: lips, mucosa, and tongue normal; teeth and gums normal Neck: no adenopathy, no carotid bruit, no JVD, supple, symmetrical, trachea midline and thyroid not enlarged, symmetric, no tenderness/mass/nodules Back: symmetric, no curvature. ROM normal. No CVA tenderness. Lungs: clear to auscultation bilaterally Breasts: normal appearance, no masses or tenderness Heart: regular rate and rhythm, S1, S2 normal, no murmur, click, rub or gallop Abdomen: soft, non-tender; bowel sounds normal; no masses,  no organomegaly Pelvic: deferred-- pt preference Extremities: extremities normal, atraumatic, no cyanosis or edema Pulses: 2+ and symmetric Skin: Skin color, texture, turgor normal. No rashes or lesions Lymph nodes: Cervical, supraclavicular, and axillary nodes normal. Neurologic: Alert and oriented X 3, normal strength and tone. Normal symmetric reflexes. Normal coordination and gait Psych-- pt complains of inc depression,  No suicidal or homicidal ideation      Assessment:    Healthy female exam.      Plan:    ghm utd Check labs See After Visit Summary for Counseling Recommendations

## 2013-04-08 NOTE — Addendum Note (Signed)
Addended by: Silvio Pate D on: 04/08/2013 04:27 PM   Modules accepted: Orders

## 2013-04-08 NOTE — Patient Instructions (Addendum)
Preventive Care for Adults, Female A healthy lifestyle and preventive care can promote health and wellness. Preventive health guidelines for women include the following key practices.  A routine yearly physical is a good way to check with your caregiver about your health and preventive screening. It is a chance to share any concerns and updates on your health, and to receive a thorough exam.  Visit your dentist for a routine exam and preventive care every 6 months. Brush your teeth twice a day and floss once a day. Good oral hygiene prevents tooth decay and gum disease.  The frequency of eye exams is based on your age, health, family medical history, use of contact lenses, and other factors. Follow your caregiver's recommendations for frequency of eye exams.  Eat a healthy diet. Foods like vegetables, fruits, whole grains, low-fat dairy products, and lean protein foods contain the nutrients you need without too many calories. Decrease your intake of foods high in solid fats, added sugars, and salt. Eat the right amount of calories for you.Get information about a proper diet from your caregiver, if necessary.  Regular physical exercise is one of the most important things you can do for your health. Most adults should get at least 150 minutes of moderate-intensity exercise (any activity that increases your heart rate and causes you to sweat) each week. In addition, most adults need muscle-strengthening exercises on 2 or more days a week.  Maintain a healthy weight. The body mass index (BMI) is a screening tool to identify possible weight problems. It provides an estimate of body fat based on height and weight. Your caregiver can help determine your BMI, and can help you achieve or maintain a healthy weight.For adults 20 years and older:  A BMI below 18.5 is considered underweight.  A BMI of 18.5 to 24.9 is normal.  A BMI of 25 to 29.9 is considered overweight.  A BMI of 30 and above is  considered obese.  Maintain normal blood lipids and cholesterol levels by exercising and minimizing your intake of saturated fat. Eat a balanced diet with plenty of fruit and vegetables. Blood tests for lipids and cholesterol should begin at age 20 and be repeated every 5 years. If your lipid or cholesterol levels are high, you are over 50, or you are at high risk for heart disease, you may need your cholesterol levels checked more frequently.Ongoing high lipid and cholesterol levels should be treated with medicines if diet and exercise are not effective.  If you smoke, find out from your caregiver how to quit. If you do not use tobacco, do not start.  If you are pregnant, do not drink alcohol. If you are breastfeeding, be very cautious about drinking alcohol. If you are not pregnant and choose to drink alcohol, do not exceed 1 drink per day. One drink is considered to be 12 ounces (355 mL) of beer, 5 ounces (148 mL) of wine, or 1.5 ounces (44 mL) of liquor.  Avoid use of street drugs. Do not share needles with anyone. Ask for help if you need support or instructions about stopping the use of drugs.  High blood pressure causes heart disease and increases the risk of stroke. Your blood pressure should be checked at least every 1 to 2 years. Ongoing high blood pressure should be treated with medicines if weight loss and exercise are not effective.  If you are 55 to 53 years old, ask your caregiver if you should take aspirin to prevent strokes.  Diabetes   screening involves taking a blood sample to check your fasting blood sugar level. This should be done once every 3 years, after age 45, if you are within normal weight and without risk factors for diabetes. Testing should be considered at a younger age or be carried out more frequently if you are overweight and have at least 1 risk factor for diabetes.  Breast cancer screening is essential preventive care for women. You should practice "breast  self-awareness." This means understanding the normal appearance and feel of your breasts and may include breast self-examination. Any changes detected, no matter how small, should be reported to a caregiver. Women in their 20s and 30s should have a clinical breast exam (CBE) by a caregiver as part of a regular health exam every 1 to 3 years. After age 40, women should have a CBE every year. Starting at age 40, women should consider having a mammography (breast X-ray test) every year. Women who have a family history of breast cancer should talk to their caregiver about genetic screening. Women at a high risk of breast cancer should talk to their caregivers about having magnetic resonance imaging (MRI) and a mammography every year.  The Pap test is a screening test for cervical cancer. A Pap test can show cell changes on the cervix that might become cervical cancer if left untreated. A Pap test is a procedure in which cells are obtained and examined from the lower end of the uterus (cervix).  Women should have a Pap test starting at age 21.  Between ages 21 and 29, Pap tests should be repeated every 2 years.  Beginning at age 30, you should have a Pap test every 3 years as long as the past 3 Pap tests have been normal.  Some women have medical problems that increase the chance of getting cervical cancer. Talk to your caregiver about these problems. It is especially important to talk to your caregiver if a new problem develops soon after your last Pap test. In these cases, your caregiver may recommend more frequent screening and Pap tests.  The above recommendations are the same for women who have or have not gotten the vaccine for human papillomavirus (HPV).  If you had a hysterectomy for a problem that was not cancer or a condition that could lead to cancer, then you no longer need Pap tests. Even if you no longer need a Pap test, a regular exam is a good idea to make sure no other problems are  starting.  If you are between ages 65 and 70, and you have had normal Pap tests going back 10 years, you no longer need Pap tests. Even if you no longer need a Pap test, a regular exam is a good idea to make sure no other problems are starting.  If you have had past treatment for cervical cancer or a condition that could lead to cancer, you need Pap tests and screening for cancer for at least 20 years after your treatment.  If Pap tests have been discontinued, risk factors (such as a new sexual partner) need to be reassessed to determine if screening should be resumed.  The HPV test is an additional test that may be used for cervical cancer screening. The HPV test looks for the virus that can cause the cell changes on the cervix. The cells collected during the Pap test can be tested for HPV. The HPV test could be used to screen women aged 30 years and older, and should   be used in women of any age who have unclear Pap test results. After the age of 30, women should have HPV testing at the same frequency as a Pap test.  Colorectal cancer can be detected and often prevented. Most routine colorectal cancer screening begins at the age of 50 and continues through age 75. However, your caregiver may recommend screening at an earlier age if you have risk factors for colon cancer. On a yearly basis, your caregiver may provide home test kits to check for hidden blood in the stool. Use of a small camera at the end of a tube, to directly examine the colon (sigmoidoscopy or colonoscopy), can detect the earliest forms of colorectal cancer. Talk to your caregiver about this at age 50, when routine screening begins. Direct examination of the colon should be repeated every 5 to 10 years through age 75, unless early forms of pre-cancerous polyps or small growths are found.  Hepatitis C blood testing is recommended for all people born from 1945 through 1965 and any individual with known risks for hepatitis C.  Practice  safe sex. Use condoms and avoid high-risk sexual practices to reduce the spread of sexually transmitted infections (STIs). STIs include gonorrhea, chlamydia, syphilis, trichomonas, herpes, HPV, and human immunodeficiency virus (HIV). Herpes, HIV, and HPV are viral illnesses that have no cure. They can result in disability, cancer, and death. Sexually active women aged 25 and younger should be checked for chlamydia. Older women with new or multiple partners should also be tested for chlamydia. Testing for other STIs is recommended if you are sexually active and at increased risk.  Osteoporosis is a disease in which the bones lose minerals and strength with aging. This can result in serious bone fractures. The risk of osteoporosis can be identified using a bone density scan. Women ages 65 and over and women at risk for fractures or osteoporosis should discuss screening with their caregivers. Ask your caregiver whether you should take a calcium supplement or vitamin D to reduce the rate of osteoporosis.  Menopause can be associated with physical symptoms and risks. Hormone replacement therapy is available to decrease symptoms and risks. You should talk to your caregiver about whether hormone replacement therapy is right for you.  Use sunscreen with sun protection factor (SPF) of 30 or more. Apply sunscreen liberally and repeatedly throughout the day. You should seek shade when your shadow is shorter than you. Protect yourself by wearing long sleeves, pants, a wide-brimmed hat, and sunglasses year round, whenever you are outdoors.  Once a month, do a whole body skin exam, using a mirror to look at the skin on your back. Notify your caregiver of new moles, moles that have irregular borders, moles that are larger than a pencil eraser, or moles that have changed in shape or color.  Stay current with required immunizations.  Influenza. You need a dose every fall (or winter). The composition of the flu vaccine  changes each year, so being vaccinated once is not enough.  Pneumococcal polysaccharide. You need 1 to 2 doses if you smoke cigarettes or if you have certain chronic medical conditions. You need 1 dose at age 65 (or older) if you have never been vaccinated.  Tetanus, diphtheria, pertussis (Tdap, Td). Get 1 dose of Tdap vaccine if you are younger than age 65, are over 65 and have contact with an infant, are a healthcare worker, are pregnant, or simply want to be protected from whooping cough. After that, you need a Td   booster dose every 10 years. Consult your caregiver if you have not had at least 3 tetanus and diphtheria-containing shots sometime in your life or have a deep or dirty wound.  HPV. You need this vaccine if you are a woman age 26 or younger. The vaccine is given in 3 doses over 6 months.  Measles, mumps, rubella (MMR). You need at least 1 dose of MMR if you were born in 1957 or later. You may also need a second dose.  Meningococcal. If you are age 19 to 21 and a first-year college student living in a residence hall, or have one of several medical conditions, you need to get vaccinated against meningococcal disease. You may also need additional booster doses.  Zoster (shingles). If you are age 60 or older, you should get this vaccine.  Varicella (chickenpox). If you have never had chickenpox or you were vaccinated but received only 1 dose, talk to your caregiver to find out if you need this vaccine.  Hepatitis A. You need this vaccine if you have a specific risk factor for hepatitis A virus infection or you simply wish to be protected from this disease. The vaccine is usually given as 2 doses, 6 to 18 months apart.  Hepatitis B. You need this vaccine if you have a specific risk factor for hepatitis B virus infection or you simply wish to be protected from this disease. The vaccine is given in 3 doses, usually over 6 months. Preventive Services / Frequency Ages 19 to 39  Blood  pressure check.** / Every 1 to 2 years.  Lipid and cholesterol check.** / Every 5 years beginning at age 20.  Clinical breast exam.** / Every 3 years for women in their 20s and 30s.  Pap test.** / Every 2 years from ages 21 through 29. Every 3 years starting at age 30 through age 65 or 70 with a history of 3 consecutive normal Pap tests.  HPV screening.** / Every 3 years from ages 30 through ages 65 to 70 with a history of 3 consecutive normal Pap tests.  Hepatitis C blood test.** / For any individual with known risks for hepatitis C.  Skin self-exam. / Monthly.  Influenza immunization.** / Every year.  Pneumococcal polysaccharide immunization.** / 1 to 2 doses if you smoke cigarettes or if you have certain chronic medical conditions.  Tetanus, diphtheria, pertussis (Tdap, Td) immunization. / A one-time dose of Tdap vaccine. After that, you need a Td booster dose every 10 years.  HPV immunization. / 3 doses over 6 months, if you are 26 and younger.  Measles, mumps, rubella (MMR) immunization. / You need at least 1 dose of MMR if you were born in 1957 or later. You may also need a second dose.  Meningococcal immunization. / 1 dose if you are age 19 to 21 and a first-year college student living in a residence hall, or have one of several medical conditions, you need to get vaccinated against meningococcal disease. You may also need additional booster doses.  Varicella immunization.** / Consult your caregiver.  Hepatitis A immunization.** / Consult your caregiver. 2 doses, 6 to 18 months apart.  Hepatitis B immunization.** / Consult your caregiver. 3 doses usually over 6 months. Ages 40 to 64  Blood pressure check.** / Every 1 to 2 years.  Lipid and cholesterol check.** / Every 5 years beginning at age 20.  Clinical breast exam.** / Every year after age 40.  Mammogram.** / Every year beginning at age 40   and continuing for as long as you are in good health. Consult with your  caregiver.  Pap test.** / Every 3 years starting at age 30 through age 65 or 70 with a history of 3 consecutive normal Pap tests.  HPV screening.** / Every 3 years from ages 30 through ages 65 to 70 with a history of 3 consecutive normal Pap tests.  Fecal occult blood test (FOBT) of stool. / Every year beginning at age 50 and continuing until age 75. You may not need to do this test if you get a colonoscopy every 10 years.  Flexible sigmoidoscopy or colonoscopy.** / Every 5 years for a flexible sigmoidoscopy or every 10 years for a colonoscopy beginning at age 50 and continuing until age 75.  Hepatitis C blood test.** / For all people born from 1945 through 1965 and any individual with known risks for hepatitis C.  Skin self-exam. / Monthly.  Influenza immunization.** / Every year.  Pneumococcal polysaccharide immunization.** / 1 to 2 doses if you smoke cigarettes or if you have certain chronic medical conditions.  Tetanus, diphtheria, pertussis (Tdap, Td) immunization.** / A one-time dose of Tdap vaccine. After that, you need a Td booster dose every 10 years.  Measles, mumps, rubella (MMR) immunization. / You need at least 1 dose of MMR if you were born in 1957 or later. You may also need a second dose.  Varicella immunization.** / Consult your caregiver.  Meningococcal immunization.** / Consult your caregiver.  Hepatitis A immunization.** / Consult your caregiver. 2 doses, 6 to 18 months apart.  Hepatitis B immunization.** / Consult your caregiver. 3 doses, usually over 6 months. Ages 65 and over  Blood pressure check.** / Every 1 to 2 years.  Lipid and cholesterol check.** / Every 5 years beginning at age 20.  Clinical breast exam.** / Every year after age 40.  Mammogram.** / Every year beginning at age 40 and continuing for as long as you are in good health. Consult with your caregiver.  Pap test.** / Every 3 years starting at age 30 through age 65 or 70 with a 3  consecutive normal Pap tests. Testing can be stopped between 65 and 70 with 3 consecutive normal Pap tests and no abnormal Pap or HPV tests in the past 10 years.  HPV screening.** / Every 3 years from ages 30 through ages 65 or 70 with a history of 3 consecutive normal Pap tests. Testing can be stopped between 65 and 70 with 3 consecutive normal Pap tests and no abnormal Pap or HPV tests in the past 10 years.  Fecal occult blood test (FOBT) of stool. / Every year beginning at age 50 and continuing until age 75. You may not need to do this test if you get a colonoscopy every 10 years.  Flexible sigmoidoscopy or colonoscopy.** / Every 5 years for a flexible sigmoidoscopy or every 10 years for a colonoscopy beginning at age 50 and continuing until age 75.  Hepatitis C blood test.** / For all people born from 1945 through 1965 and any individual with known risks for hepatitis C.  Osteoporosis screening.** / A one-time screening for women ages 65 and over and women at risk for fractures or osteoporosis.  Skin self-exam. / Monthly.  Influenza immunization.** / Every year.  Pneumococcal polysaccharide immunization.** / 1 dose at age 65 (or older) if you have never been vaccinated.  Tetanus, diphtheria, pertussis (Tdap, Td) immunization. / A one-time dose of Tdap vaccine if you are over   65 and have contact with an infant, are a healthcare worker, or simply want to be protected from whooping cough. After that, you need a Td booster dose every 10 years.  Varicella immunization.** / Consult your caregiver.  Meningococcal immunization.** / Consult your caregiver.  Hepatitis A immunization.** / Consult your caregiver. 2 doses, 6 to 18 months apart.  Hepatitis B immunization.** / Check with your caregiver. 3 doses, usually over 6 months. ** Family history and personal history of risk and conditions may change your caregiver's recommendations. Document Released: 12/25/2001 Document Revised: 01/21/2012  Document Reviewed: 03/26/2011 [employer] Patient Information 2014 [employer].  

## 2013-04-08 NOTE — Assessment & Plan Note (Signed)
Stable con't meds 

## 2013-04-10 ENCOUNTER — Other Ambulatory Visit: Payer: Self-pay

## 2013-04-10 MED ORDER — CIPROFLOXACIN HCL 500 MG PO TABS: 500.0000 mg | ORAL_TABLET | Freq: Two times a day (BID) | ORAL | Status: DC

## 2013-04-11 LAB — URINE CULTURE

## 2013-04-12 LAB — VITAMIN D 1,25 DIHYDROXY: Vitamin D 1, 25 (OH)2 Total: 78 pg/mL — ABNORMAL HIGH (ref 18–72)

## 2013-04-13 ENCOUNTER — Other Ambulatory Visit: Payer: Self-pay | Admitting: Family Medicine

## 2013-04-20 ENCOUNTER — Other Ambulatory Visit: Payer: Self-pay | Admitting: Family Medicine

## 2013-04-21 NOTE — Telephone Encounter (Signed)
Per note Rx was not received by the pharmacy. I will call and verify once the pharmacy is opened.                KP

## 2013-04-21 NOTE — Telephone Encounter (Signed)
According to the pharmacy that patient picked up the Rx on 04/12/13. She is requesting the Rx and she stated it was never received by the pharmacy, Please advise.    KP

## 2013-04-23 ENCOUNTER — Other Ambulatory Visit (INDEPENDENT_AMBULATORY_CARE_PROVIDER_SITE_OTHER): Payer: Self-pay

## 2013-04-23 ENCOUNTER — Ambulatory Visit (INDEPENDENT_AMBULATORY_CARE_PROVIDER_SITE_OTHER): Payer: 59 | Admitting: Licensed Clinical Social Worker

## 2013-04-23 DIAGNOSIS — F331 Major depressive disorder, recurrent, moderate: Secondary | ICD-10-CM

## 2013-04-30 ENCOUNTER — Ambulatory Visit (INDEPENDENT_AMBULATORY_CARE_PROVIDER_SITE_OTHER): Payer: 59 | Admitting: Licensed Clinical Social Worker

## 2013-04-30 DIAGNOSIS — F331 Major depressive disorder, recurrent, moderate: Secondary | ICD-10-CM

## 2013-05-06 ENCOUNTER — Encounter: Payer: Self-pay | Admitting: Family Medicine

## 2013-05-06 ENCOUNTER — Ambulatory Visit (INDEPENDENT_AMBULATORY_CARE_PROVIDER_SITE_OTHER): Payer: 59 | Admitting: Family Medicine

## 2013-05-06 VITALS — BP 124/76 | HR 85 | Temp 98.3°F | Wt 228.2 lb

## 2013-05-06 DIAGNOSIS — I10 Essential (primary) hypertension: Secondary | ICD-10-CM

## 2013-05-06 DIAGNOSIS — N390 Urinary tract infection, site not specified: Secondary | ICD-10-CM

## 2013-05-06 LAB — POCT URINALYSIS DIPSTICK
Glucose, UA: NEGATIVE
Ketones, UA: NEGATIVE
Spec Grav, UA: <=1.005
Urobilinogen, UA: 0.2

## 2013-05-06 NOTE — Progress Notes (Signed)
  Subjective:    Patient here for follow-up of elevated blood pressure.  She is not exercising and is adherent to a low-salt diet.  Blood pressure is well controlled at home. Cardiac symptoms: none. Patient denies: chest pain, chest pressure/discomfort, claudication, dyspnea, exertional chest pressure/discomfort, fatigue, irregular heart beat, lower extremity edema, near-syncope, orthopnea, palpitations, paroxysmal nocturnal dyspnea, syncope and tachypnea. Cardiovascular risk factors: hypertension, obesity (BMI >= 30 kg/m2) and sedentary lifestyle. Use of agents associated with hypertension: none. History of target organ damage: none.  The following portions of the patient's history were reviewed and updated as appropriate: allergies, current medications, past family history, past medical history, past social history, past surgical history and problem list.  Review of Systems Pertinent items are noted in HPI.     Objective:    BP 124/76  Pulse 85  Temp(Src) 98.3 F (36.8 C) (Oral)  Wt 228 lb 3.2 oz (103.511 kg)  BMI 37.97 kg/m2  SpO2 98% General appearance: alert, cooperative, appears stated age and no distress Lungs: clear to auscultation bilaterally and normal percussion bilaterally Heart: S1, S2 normal Extremities: extremities normal, atraumatic, no cyanosis or edema    Assessment:    Hypertension, normal blood pressure . Evidence of target organ damage: none.    Plan:    Medication: no change. Dietary sodium restriction. Regular aerobic exercise. Check blood pressures 2-3 times weekly and record. Follow up: 3 months and as needed.

## 2013-05-06 NOTE — Patient Instructions (Signed)

## 2013-05-07 LAB — URINE CULTURE: Colony Count: 3000

## 2013-05-08 ENCOUNTER — Encounter: Payer: Self-pay | Admitting: Family Medicine

## 2013-05-14 ENCOUNTER — Ambulatory Visit (INDEPENDENT_AMBULATORY_CARE_PROVIDER_SITE_OTHER): Payer: 59 | Admitting: Licensed Clinical Social Worker

## 2013-05-14 DIAGNOSIS — F331 Major depressive disorder, recurrent, moderate: Secondary | ICD-10-CM

## 2013-05-15 ENCOUNTER — Other Ambulatory Visit: Payer: Self-pay | Admitting: Family Medicine

## 2013-05-28 ENCOUNTER — Ambulatory Visit (INDEPENDENT_AMBULATORY_CARE_PROVIDER_SITE_OTHER): Payer: 59 | Admitting: Licensed Clinical Social Worker

## 2013-05-28 DIAGNOSIS — F331 Major depressive disorder, recurrent, moderate: Secondary | ICD-10-CM

## 2013-06-23 ENCOUNTER — Ambulatory Visit (INDEPENDENT_AMBULATORY_CARE_PROVIDER_SITE_OTHER): Payer: 59 | Admitting: Licensed Clinical Social Worker

## 2013-06-23 DIAGNOSIS — F331 Major depressive disorder, recurrent, moderate: Secondary | ICD-10-CM

## 2013-07-09 ENCOUNTER — Ambulatory Visit (INDEPENDENT_AMBULATORY_CARE_PROVIDER_SITE_OTHER): Payer: 59 | Admitting: Family Medicine

## 2013-07-09 ENCOUNTER — Encounter: Payer: Self-pay | Admitting: Family Medicine

## 2013-07-09 ENCOUNTER — Ambulatory Visit (INDEPENDENT_AMBULATORY_CARE_PROVIDER_SITE_OTHER): Payer: 59 | Admitting: Licensed Clinical Social Worker

## 2013-07-09 VITALS — BP 120/78 | HR 91 | Temp 97.8°F | Wt 231.2 lb

## 2013-07-09 DIAGNOSIS — N898 Other specified noninflammatory disorders of vagina: Secondary | ICD-10-CM

## 2013-07-09 DIAGNOSIS — F331 Major depressive disorder, recurrent, moderate: Secondary | ICD-10-CM

## 2013-07-09 DIAGNOSIS — R350 Frequency of micturition: Secondary | ICD-10-CM

## 2013-07-09 LAB — POCT URINALYSIS DIPSTICK
Bilirubin, UA: NEGATIVE
Glucose, UA: NEGATIVE
Nitrite, UA: NEGATIVE
Urobilinogen, UA: 0.2

## 2013-07-09 MED ORDER — CIPROFLOXACIN HCL 500 MG PO TABS: 500.0000 mg | ORAL_TABLET | Freq: Two times a day (BID) | ORAL | Status: AC

## 2013-07-09 NOTE — Progress Notes (Signed)
  Subjective:    Teresa Nixon is a 52 y.o. female who complains of abnormal smelling urine, burning with urination, frequency and suprapubic pressure. She has had symptoms for 1 week. Patient also complains of vaginal discharge. Patient denies back pain, congestion, cough, fever, headache, rhinitis, sorethroat and stomach ache. Patient does not have a history of recurrent UTI. Patient does not have a history of pyelonephritis.   The following portions of the patient's history were reviewed and updated as appropriate: allergies, current medications, past family history, past medical history, past social history, past surgical history and problem list.  Review of Systems Pertinent items are noted in HPI.    Objective:    BP 120/78  Pulse 91  Temp(Src) 97.8 F (36.6 C) (Oral)  Wt 231 lb 3.2 oz (104.872 kg)  BMI 38.47 kg/m2  SpO2 98% General appearance: alert, cooperative, appears stated age and no distress Abdomen: soft, non-tender; bowel sounds normal; no masses,  no organomegaly Pelvic: cervix normal in appearance, external genitalia normal and white d/c no odor  Laboratory:  Urine dipstick: trace for hemoglobin and mod for leukocyte esterase.   Micro exam: not done.    Assessment:    Vulvovaginitis  and dysuria   Plan:    Medications: ciprofloxacin. Maintain adequate hydration. Follow up if symptoms not improving, and as needed.  Wet prep sent

## 2013-07-09 NOTE — Patient Instructions (Signed)

## 2013-07-10 ENCOUNTER — Other Ambulatory Visit: Payer: Self-pay

## 2013-07-10 LAB — WET PREP BY MOLECULAR PROBE
Candida species: NEGATIVE
Gardnerella vaginalis: POSITIVE — AB
Trichomonas vaginosis: NEGATIVE

## 2013-07-10 MED ORDER — METRONIDAZOLE 0.75 % VA GEL: 1.0000 | Freq: Every day | VAGINAL | Status: DC

## 2013-07-11 LAB — URINE CULTURE

## 2013-07-21 ENCOUNTER — Ambulatory Visit (INDEPENDENT_AMBULATORY_CARE_PROVIDER_SITE_OTHER): Payer: 59 | Admitting: Licensed Clinical Social Worker

## 2013-07-21 DIAGNOSIS — F331 Major depressive disorder, recurrent, moderate: Secondary | ICD-10-CM

## 2013-08-11 ENCOUNTER — Ambulatory Visit (INDEPENDENT_AMBULATORY_CARE_PROVIDER_SITE_OTHER): Payer: 59 | Admitting: Licensed Clinical Social Worker

## 2013-08-11 DIAGNOSIS — F331 Major depressive disorder, recurrent, moderate: Secondary | ICD-10-CM

## 2013-08-25 ENCOUNTER — Ambulatory Visit: Payer: 59 | Admitting: Licensed Clinical Social Worker

## 2013-09-11 ENCOUNTER — Other Ambulatory Visit: Payer: Self-pay | Admitting: Family Medicine

## 2013-09-17 ENCOUNTER — Other Ambulatory Visit: Payer: Self-pay

## 2014-03-11 ENCOUNTER — Other Ambulatory Visit: Payer: Self-pay | Admitting: Family Medicine

## 2014-03-12 ENCOUNTER — Other Ambulatory Visit: Payer: Self-pay | Admitting: Family Medicine

## 2014-04-13 ENCOUNTER — Ambulatory Visit (INDEPENDENT_AMBULATORY_CARE_PROVIDER_SITE_OTHER): Payer: 59 | Admitting: Family Medicine

## 2014-04-13 ENCOUNTER — Encounter: Payer: Self-pay | Admitting: Family Medicine

## 2014-04-13 VITALS — BP 110/66 | HR 84 | Temp 98.2°F | Wt 221.2 lb

## 2014-04-13 DIAGNOSIS — I10 Essential (primary) hypertension: Secondary | ICD-10-CM

## 2014-04-13 DIAGNOSIS — E039 Hypothyroidism, unspecified: Secondary | ICD-10-CM

## 2014-04-13 MED ORDER — AMLODIPINE BESYLATE 5 MG PO TABS: ORAL_TABLET | ORAL | Status: DC

## 2014-04-13 NOTE — Progress Notes (Signed)
Pre visit review using our clinic review tool, if applicable. No additional management support is needed unless otherwise documented below in the visit note. 

## 2014-04-13 NOTE — Progress Notes (Signed)
  Subjective:    Patient here for follow-up of elevated blood pressure.  She is not exercising and is not adherent to a low-salt diet.  Blood pressure is well controlled at home. Cardiac symptoms: none. Patient denies: chest pain, chest pressure/discomfort, claudication, dyspnea, exertional chest pressure/discomfort, fatigue, irregular heart beat, lower extremity edema, near-syncope, orthopnea, palpitations, paroxysmal nocturnal dyspnea, syncope and tachypnea. Cardiovascular risk factors: hypertension, obesity (BMI >= 30 kg/m2) and sedentary lifestyle. Use of agents associated with hypertension: none. History of target organ damage: none.  The following portions of the patient's history were reviewed and updated as appropriate: allergies, current medications, past family history, past medical history, past social history, past surgical history and problem list.  Review of Systems Pertinent items are noted in HPI.     Objective:    BP 110/66  Pulse 84  Temp(Src) 98.2 F (36.8 C) (Oral)  Wt 221 lb 3.2 oz (100.336 kg)  SpO2 97% General appearance: alert, cooperative, appears stated age and no distress Throat: lips, mucosa, and tongue normal; teeth and gums normal Neck: no adenopathy, supple, symmetrical, trachea midline and thyroid not enlarged, symmetric, no tenderness/mass/nodules Lungs: clear to auscultation bilaterally Heart: S1, S2 normal Extremities: extremities normal, atraumatic, no cyanosis or edema    Assessment:    Hypertension, normal blood pressure . Evidence of target organ damage: none.    Plan:    Medication: no change. Dietary sodium restriction. Regular aerobic exercise. Check blood pressures 2-3 times weekly and record. Follow up: 6 months and as needed.   1. HTN (hypertension)   - amLODipine (NORVASC) 5 MG tablet; 1 tab by mouth daily-  Dispense: 90 tablet; Refill: 1 - Basic metabolic panel - Hepatic function panel - Lipid panel - TSH - POCT urinalysis  dipstick  2. Unspecified hypothyroidism Check labs

## 2014-04-13 NOTE — Patient Instructions (Signed)

## 2014-04-14 ENCOUNTER — Telehealth: Payer: Self-pay | Admitting: Family Medicine

## 2014-04-14 NOTE — Telephone Encounter (Signed)
Relevant patient education assigned to patient using Emmi. ° °

## 2014-04-15 ENCOUNTER — Other Ambulatory Visit: Payer: Self-pay | Admitting: Family Medicine

## 2014-04-25 ENCOUNTER — Other Ambulatory Visit: Payer: Self-pay | Admitting: Family Medicine

## 2014-07-27 ENCOUNTER — Other Ambulatory Visit: Payer: Self-pay | Admitting: Family Medicine

## 2015-01-27 ENCOUNTER — Other Ambulatory Visit: Payer: Self-pay | Admitting: Family Medicine

## 2015-03-17 ENCOUNTER — Encounter: Payer: Self-pay | Admitting: Family Medicine

## 2015-03-17 ENCOUNTER — Ambulatory Visit (INDEPENDENT_AMBULATORY_CARE_PROVIDER_SITE_OTHER): Payer: 59 | Admitting: Family Medicine

## 2015-03-17 VITALS — BP 120/80 | HR 81 | Temp 98.0°F | Wt 210.2 lb

## 2015-03-17 DIAGNOSIS — N952 Postmenopausal atrophic vaginitis: Secondary | ICD-10-CM | POA: Diagnosis not present

## 2015-03-17 DIAGNOSIS — E039 Hypothyroidism, unspecified: Secondary | ICD-10-CM | POA: Diagnosis not present

## 2015-03-17 DIAGNOSIS — I10 Essential (primary) hypertension: Secondary | ICD-10-CM

## 2015-03-17 LAB — HEPATIC FUNCTION PANEL
ALBUMIN: 3.9 g/dL (ref 3.5–5.2)
ALT: 18 U/L (ref 0–35)
AST: 19 U/L (ref 0–37)
Alkaline Phosphatase: 116 U/L (ref 39–117)
Bilirubin, Direct: 0.1 mg/dL (ref 0.0–0.3)
Total Bilirubin: 0.4 mg/dL (ref 0.2–1.2)
Total Protein: 7.7 g/dL (ref 6.0–8.3)

## 2015-03-17 LAB — BASIC METABOLIC PANEL
BUN: 10 mg/dL (ref 6–23)
CALCIUM: 9.4 mg/dL (ref 8.4–10.5)
CO2: 24 meq/L (ref 19–32)
CREATININE: 0.77 mg/dL (ref 0.40–1.20)
Chloride: 108 mEq/L (ref 96–112)
GFR: 82.78 mL/min (ref >60.00)
Glucose, Bld: 85 mg/dL (ref 70–99)
Potassium: 4.2 mEq/L (ref 3.5–5.1)
Sodium: 138 mEq/L (ref 135–145)

## 2015-03-17 LAB — TSH: TSH: 1.26 u[IU]/mL (ref 0.35–4.50)

## 2015-03-17 LAB — LIPID PANEL
CHOL/HDL RATIO: 4
Cholesterol: 210 mg/dL — ABNORMAL HIGH (ref 0–200)
HDL: 55.5 mg/dL (ref >39.00)
LDL CALC: 122 mg/dL — AB (ref 0–99)
NONHDL: 154.5
Triglycerides: 163 mg/dL — ABNORMAL HIGH (ref 0.0–149.0)
VLDL: 32.6 mg/dL (ref 0.0–40.0)

## 2015-03-17 MED ORDER — AMLODIPINE BESYLATE 5 MG PO TABS: 5.0000 mg | ORAL_TABLET | Freq: Every day | ORAL | Status: DC

## 2015-03-17 MED ORDER — OSPEMIFENE 60 MG PO TABS: 1.0000 | ORAL_TABLET | Freq: Every day | ORAL | Status: DC

## 2015-03-17 NOTE — Patient Instructions (Signed)

## 2015-03-17 NOTE — Progress Notes (Signed)
Patient ID: Teresa Nixon, female    DOB: Mar 29, 1961  Age: 54 y.o. MRN: [id number]    Subjective:  Subjective HPI Teresa Nixon presents for f/u htn. Thyroid.  No complaints.  Review of Systems  Constitutional: Negative for activity change, appetite change, fatigue and unexpected weight change.  Respiratory: Negative for cough and shortness of breath.   Cardiovascular: Negative for chest pain and palpitations.  Psychiatric/Behavioral: Negative for behavioral problems and dysphoric mood. The patient is not nervous/anxious.     History Past Medical History  Diagnosis Date  . Hypertension   . Depression   . Thyroid disease     hypothyroid    She has past surgical history that includes Gastric bypass (2001); Breast reduction surgery (10/2003); and Abdominoplasty (03/2007).   Her family history includes Breast cancer in her paternal aunt; Cancer in her father and paternal grandmother; Coronary artery disease in her paternal grandfather; Diabetes in her father; Hypertension in her mother; Stroke in her father.She reports that she has never smoked. She has never used smokeless tobacco. She reports that she drinks alcohol. She reports that she does not use illicit drugs.  Current Outpatient Prescriptions on File Prior to Visit  Medication Sig Dispense Refill  . BRINTELLIX 20 MG TABS Take 20 mg by mouth daily.    . Cranberry 1000 MG CAPS Take 2 capsules by mouth daily.     . Cyanocobalamin (VITAMIN B 12 PO) Take 1,000 mcg by mouth once a week.      . levothyroxine (SYNTHROID, LEVOTHROID) 137 MCG tablet TAKE 1 TABLET BY MOUTH DAILY. 30 tablet 10  . loratadine (CLARITIN) 10 MG tablet Take 10 mg by mouth daily.      . Multiple Vitamin (MULTIVITAMIN) tablet Take 1 tablet by mouth daily.    Marland Kitchen PREMARIN vaginal cream INSERT 1 GM VAGINALLY EVERY DAY  FOR 1 WEEK THEN DECREASE TO 3 TIMES AWEEK 30 g 10   No current facility-administered medications on file prior to visit.     Objective:   Objective Physical Exam  Constitutional: She is oriented to person, place, and time. She appears well-developed and well-nourished. No distress.  HENT:  Right Ear: External ear normal.  Left Ear: External ear normal.  Nose: Nose normal.  Mouth/Throat: Oropharynx is clear and moist.  Eyes: EOM are normal. Pupils are equal, round, and reactive to light.  Neck: Normal range of motion. Neck supple. No thyromegaly present.  Cardiovascular: Normal rate, regular rhythm and normal heart sounds.   No murmur heard. Pulmonary/Chest: Effort normal and breath sounds normal. No respiratory distress. She has no wheezes. She has no rales. She exhibits no tenderness.  Neurological: She is alert and oriented to person, place, and time.  Psychiatric: She has a normal mood and affect. Her behavior is normal. Judgment and thought content normal.   BP 120/80 mmHg  Pulse 81  Temp(Src) 98 F (36.7 C) (Oral)  Wt 210 lb 3.2 oz (95.346 kg)  SpO2 98% Wt Readings from Last 3 Encounters:  03/17/15 210 lb 3.2 oz (95.346 kg)  04/13/14 221 lb 3.2 oz (100.336 kg)  07/09/13 231 lb 3.2 oz (104.872 kg)     Lab Results  Component Value Date   WBC 6.5 04/08/2013   HGB 13.7 04/08/2013   HCT 41.1 04/08/2013   PLT 344.0 04/08/2013   GLUCOSE 85 03/17/2015   CHOL 210* 03/17/2015   TRIG 163.0* 03/17/2015   HDL 55.50 03/17/2015   LDLDIRECT 133.6 04/08/2013   LDLCALC 122* 03/17/2015  ALT 18 03/17/2015   AST 19 03/17/2015   NA 138 03/17/2015   K 4.2 03/17/2015   CL 108 03/17/2015   CREATININE 0.77 03/17/2015   BUN 10 03/17/2015   CO2 24 03/17/2015   TSH 1.26 03/17/2015    No results found.   Assessment & Plan:  Plan I am having Teresa Nixon start on Ospemifene. I am also having her maintain her loratadine, Cranberry, Cyanocobalamin (VITAMIN B 12 PO), multivitamin, BRINTELLIX, levothyroxine, PREMARIN, ARIPiprazole, hydrOXYzine, Calcium-Vitamin D (CALTRATE 600 PLUS-VIT D PO), and amLODipine.  Meds ordered  this encounter  Medications  . ARIPiprazole (ABILIFY) 2 MG tablet    Sig: Take 1 tablet by mouth daily.  . hydrOXYzine (VISTARIL) 50 MG capsule    Sig: Take 1-2 capsules by mouth as needed.  . Calcium-Vitamin D (CALTRATE 600 PLUS-VIT D PO)    Sig: Take 1 tablet by mouth daily.  Marland Kitchen amLODipine (NORVASC) 5 MG tablet    Sig: Take 1 tablet (5 mg total) by mouth daily. Office visit due now    Dispense:  90 tablet    Refill:  3    D/C PREVIOUS SCRIPTS FOR THIS MEDICATION  . Ospemifene (OSPHENA) 60 MG TABS    Sig: Take 1 tablet by mouth daily.    Dispense:  30 tablet    Refill:  11    Problem List Items Addressed This Visit    None    Visit Diagnoses    Essential hypertension    -  Primary    Relevant Medications    amLODipine (NORVASC) 5 MG tablet    Other Relevant Orders    Basic metabolic panel (Completed)    Hepatic function panel (Completed)    Lipid panel (Completed)    Hypothyroidism, unspecified hypothyroidism type        Relevant Orders    Hepatic function panel (Completed)    Lipid panel (Completed)    TSH (Completed)    Vaginal atrophy        Relevant Medications    Ospemifene (OSPHENA) 60 MG TABS       Follow-up: Return in about 6 months (around 09/17/2015), or if symptoms worsen or fail to improve, for cpe.  Garnet Koyanagi, DO

## 2015-03-17 NOTE — Progress Notes (Signed)
Pre visit review using our clinic review tool, if applicable. No additional management support is needed unless otherwise documented below in the visit note. 

## 2015-03-18 ENCOUNTER — Other Ambulatory Visit: Payer: Self-pay | Admitting: Family Medicine

## 2015-05-10 ENCOUNTER — Other Ambulatory Visit: Payer: Self-pay | Admitting: Family Medicine

## 2015-07-06 ENCOUNTER — Other Ambulatory Visit: Payer: Self-pay | Admitting: Family Medicine

## 2015-07-06 NOTE — Telephone Encounter (Signed)
Premarin refill sent to pharmacy.  Pt last seen 03/17/15 and advised CPE in 6 months. Please call pt to arrange CPE on or after 09/17/15 before the slots are filled.  Thanks!

## 2015-09-15 ENCOUNTER — Other Ambulatory Visit: Payer: Self-pay | Admitting: Family Medicine

## 2016-03-21 ENCOUNTER — Other Ambulatory Visit: Payer: Self-pay | Admitting: Family Medicine

## 2016-03-21 NOTE — Telephone Encounter (Signed)
Please schedule a HTN follow up.     KP

## 2016-04-10 NOTE — Telephone Encounter (Signed)
Left message for patient to call and schedule follow up

## 2016-05-04 ENCOUNTER — Other Ambulatory Visit: Payer: Self-pay | Admitting: Family Medicine

## 2016-06-01 ENCOUNTER — Encounter: Payer: Self-pay | Admitting: Family Medicine

## 2016-06-01 ENCOUNTER — Ambulatory Visit (INDEPENDENT_AMBULATORY_CARE_PROVIDER_SITE_OTHER): Payer: 59 | Admitting: Family Medicine

## 2016-06-01 VITALS — BP 127/84 | HR 76 | Temp 97.6°F | Ht 65.0 in | Wt 207.4 lb

## 2016-06-01 DIAGNOSIS — L850 Acquired ichthyosis: Secondary | ICD-10-CM | POA: Diagnosis not present

## 2016-06-01 DIAGNOSIS — Z0000 Encounter for general adult medical examination without abnormal findings: Secondary | ICD-10-CM | POA: Diagnosis not present

## 2016-06-01 DIAGNOSIS — L853 Xerosis cutis: Secondary | ICD-10-CM | POA: Insufficient documentation

## 2016-06-01 DIAGNOSIS — I10 Essential (primary) hypertension: Secondary | ICD-10-CM

## 2016-06-01 DIAGNOSIS — E039 Hypothyroidism, unspecified: Secondary | ICD-10-CM | POA: Diagnosis not present

## 2016-06-01 LAB — LIPID PANEL
Cholesterol: 234 mg/dL — ABNORMAL HIGH (ref 0–200)
HDL: 75 mg/dL (ref >39.00)
LDL CALC: 130 mg/dL — AB (ref 0–99)
NonHDL: 158.63
TRIGLYCERIDES: 144 mg/dL (ref 0.0–149.0)
Total CHOL/HDL Ratio: 3
VLDL: 28.8 mg/dL (ref 0.0–40.0)

## 2016-06-01 LAB — COMPREHENSIVE METABOLIC PANEL
ALT: 17 U/L (ref 0–35)
AST: 17 U/L (ref 0–37)
Albumin: 4 g/dL (ref 3.5–5.2)
Alkaline Phosphatase: 102 U/L (ref 39–117)
BUN: 15 mg/dL (ref 6–23)
CALCIUM: 9.4 mg/dL (ref 8.4–10.5)
CHLORIDE: 107 meq/L (ref 96–112)
CO2: 27 meq/L (ref 19–32)
CREATININE: 0.81 mg/dL (ref 0.40–1.20)
GFR: 77.74 mL/min (ref >60.00)
Glucose, Bld: 95 mg/dL (ref 70–99)
POTASSIUM: 4.8 meq/L (ref 3.5–5.1)
Sodium: 141 mEq/L (ref 135–145)
Total Bilirubin: 0.3 mg/dL (ref 0.2–1.2)
Total Protein: 7.5 g/dL (ref 6.0–8.3)

## 2016-06-01 LAB — TSH: TSH: 1.59 u[IU]/mL (ref 0.35–4.50)

## 2016-06-01 NOTE — Assessment & Plan Note (Signed)
eucerin cream mix with cortisone bid and eucerin prn

## 2016-06-01 NOTE — Progress Notes (Signed)
Pre visit review using our clinic review tool, if applicable. No additional management support is needed unless otherwise documented below in the visit note. 

## 2016-06-01 NOTE — Progress Notes (Signed)
Subjective:    Patient ID: Teresa Nixon, female    DOB: May 03, 1961, 55 y.o.   MRN: [id number]  Chief Complaint  Patient presents with  . Thyroid Problem    follow up/Labs are due now    HPI Patient is in today for f/u thyroid.  C/o dry patches of skin on elbow, tops of thighs and back of shoulders.  Past Medical History  Diagnosis Date  . Hypertension   . Depression   . Thyroid disease     hypothyroid    Past Surgical History  Procedure Laterality Date  . Gastric bypass  2001  . Breast reduction surgery  10/2003  . Abdominoplasty  03/2007    Family History  Problem Relation Age of Onset  . Hypertension Mother   . Diabetes Father   . Stroke Father   . Cancer Father     Bladder Cancer  . Coronary artery disease Paternal Grandfather   . Breast cancer Paternal Aunt   . Cancer Paternal Grandmother     Bladder Cancer    Social History   Social History  . Marital Status: Divorced    Spouse Name: N/A  . Number of Children: N/A  . Years of Education: N/A   Occupational History  . [employer]-- compliance officer    Social History Main Topics  . Smoking status: Never Smoker   . Smokeless tobacco: Never Used  . Alcohol Use: Yes  . Drug Use: No  . Sexual Activity:    Partners: Male     Nixon: single   Other Topics Concern  . Not on file   Social History Narrative   Exercise--walks 4000 steps    Outpatient Prescriptions Prior to Visit  Medication Sig Dispense Refill  . amLODipine (NORVASC) 5 MG tablet TAKE ONE TABLET BY MOUTH EVERY DAY 30 tablet 0  . ARIPiprazole (ABILIFY) 2 MG tablet Take 1 tablet by mouth daily.    Marland Kitchen BRINTELLIX 20 MG TABS Take 20 mg by mouth daily.    . Calcium-Vitamin D (CALTRATE 600 PLUS-VIT D PO) Take 1 tablet by mouth daily.    . Cranberry 1000 MG CAPS Take 2 capsules by mouth daily.     . Cyanocobalamin (VITAMIN B 12 PO) Take 1,000 mcg by mouth once a week.      . hydrOXYzine (VISTARIL) 50 MG capsule Take 1-2  capsules by mouth as needed.    Marland Kitchen levothyroxine (SYNTHROID, LEVOTHROID) 137 MCG tablet Take 1 tablet (137 mcg total) by mouth daily. REPEAT LABS ARE DUE NOW 30 tablet 0  . loratadine (CLARITIN) 10 MG tablet Take 10 mg by mouth daily.      . Multiple Vitamin (MULTIVITAMIN) tablet Take 1 tablet by mouth daily.    . Ospemifene (OSPHENA) 60 MG TABS Take 1 tablet by mouth daily. 30 tablet 11  . PREMARIN vaginal cream INSERT 1 GM VAGINALLY EVERY DAY  FOR 1 WEEK THEN DECREASE TO 3 TIMES A WEEK 30 g 3   No facility-administered medications prior to visit.    No Known Allergies  Review of Systems  Constitutional: Negative for fever, chills and malaise/fatigue.  HENT: Negative for congestion and hearing loss.   Eyes: Negative for discharge.  Respiratory: Negative for cough, sputum production and shortness of breath.   Cardiovascular: Negative for chest pain, palpitations and leg swelling.  Gastrointestinal: Negative for heartburn, nausea, vomiting, abdominal pain, diarrhea, constipation and blood in stool.  Genitourinary: Negative for dysuria, urgency, frequency and hematuria.  Musculoskeletal: Negative  for myalgias, back pain and falls.  Skin: Negative for rash.  Neurological: Negative for dizziness, sensory change, loss of consciousness, weakness and headaches.  Endo/Heme/Allergies: Negative for environmental allergies. Does not bruise/bleed easily.  Psychiatric/Behavioral: Negative for depression and suicidal ideas. The patient is not nervous/anxious and does not have insomnia.        Objective:    Physical Exam  Constitutional: She is oriented to person, place, and time. She appears well-developed and well-nourished.  HENT:  Head: Normocephalic and atraumatic.  Eyes: Conjunctivae and EOM are normal.  Neck: Normal range of motion. Neck supple. No JVD present. Carotid bruit is not present. No thyromegaly present.  Cardiovascular: Normal rate, regular rhythm and normal heart sounds.   No  murmur heard. Pulmonary/Chest: Effort normal and breath sounds normal. No respiratory distress. She has no wheezes. She has no rales. She exhibits no tenderness.  Musculoskeletal: She exhibits no edema.  Neurological: She is alert and oriented to person, place, and time.  Skin: Skin is dry.     Psychiatric: She has a normal mood and affect.  Nursing note and vitals reviewed.   BP 127/84 mmHg  Pulse 76  Temp(Src) 97.6 F (36.4 C) (Oral)  Ht 5\' 5"  (1.651 m)  Wt 207 lb 6.4 oz (94.076 kg)  BMI 34.51 kg/m2  SpO2 98% Wt Readings from Last 3 Encounters:  06/01/16 207 lb 6.4 oz (94.076 kg)  03/17/15 210 lb 3.2 oz (95.346 kg)  04/13/14 221 lb 3.2 oz (100.336 kg)     Lab Results  Component Value Date   WBC 6.5 04/08/2013   HGB 13.7 04/08/2013   HCT 41.1 04/08/2013   PLT 344.0 04/08/2013   GLUCOSE 95 06/01/2016   CHOL 234* 06/01/2016   TRIG 144.0 06/01/2016   HDL 75.00 06/01/2016   LDLDIRECT 133.6 04/08/2013   LDLCALC 130* 06/01/2016   ALT 17 06/01/2016   AST 17 06/01/2016   NA 141 06/01/2016   K 4.8 06/01/2016   CL 107 06/01/2016   CREATININE 0.81 06/01/2016   BUN 15 06/01/2016   CO2 27 06/01/2016   TSH 1.59 06/01/2016    Lab Results  Component Value Date   TSH 1.59 06/01/2016   Lab Results  Component Value Date   WBC 6.5 04/08/2013   HGB 13.7 04/08/2013   HCT 41.1 04/08/2013   MCV 89.0 04/08/2013   PLT 344.0 04/08/2013   Lab Results  Component Value Date   NA 141 06/01/2016   K 4.8 06/01/2016   CO2 27 06/01/2016   GLUCOSE 95 06/01/2016   BUN 15 06/01/2016   CREATININE 0.81 06/01/2016   BILITOT 0.3 06/01/2016   ALKPHOS 102 06/01/2016   AST 17 06/01/2016   ALT 17 06/01/2016   PROT 7.5 06/01/2016   ALBUMIN 4.0 06/01/2016   CALCIUM 9.4 06/01/2016   GFR 77.74 06/01/2016   Lab Results  Component Value Date   CHOL 234* 06/01/2016   Lab Results  Component Value Date   HDL 75.00 06/01/2016   Lab Results  Component Value Date   LDLCALC 130*  06/01/2016   Lab Results  Component Value Date   TRIG 144.0 06/01/2016   Lab Results  Component Value Date   CHOLHDL 3 06/01/2016   No results found for: HGBA1C     Assessment & Plan:   Problem List Items Addressed This Visit      Unprioritized   Dry skin dermatitis    eucerin cream mix with cortisone bid and eucerin prn  HYPERTENSION, BENIGN ESSENTIAL    Stable con't norvasc      Hypothyroidism - Primary    con't synthroid Check tsh      Relevant Orders   TSH (Completed)    Other Visit Diagnoses    Preventative health care        Relevant Orders    Comprehensive metabolic panel (Completed)    Lipid panel (Completed)    TSH (Completed)       I have discontinued Ms. Church's Ospemifene and PREMARIN. I am also having her maintain her loratadine, Cranberry, Cyanocobalamin (VITAMIN B 12 PO), multivitamin, BRINTELLIX, ARIPiprazole, hydrOXYzine, Calcium-Vitamin D (CALTRATE 600 PLUS-VIT D PO), amLODipine, and levothyroxine.  No orders of the defined types were placed in this encounter.     Ann Held, DO

## 2016-06-01 NOTE — Assessment & Plan Note (Signed)
con't synthroid Check tsh 

## 2016-06-01 NOTE — Patient Instructions (Signed)

## 2016-06-01 NOTE — Assessment & Plan Note (Signed)
Stable  con't norvasc 

## 2016-06-05 ENCOUNTER — Other Ambulatory Visit: Payer: Self-pay | Admitting: Family Medicine

## 2016-10-27 ENCOUNTER — Other Ambulatory Visit: Payer: Self-pay | Admitting: Family Medicine

## 2017-04-10 ENCOUNTER — Other Ambulatory Visit: Payer: Self-pay | Admitting: Family Medicine

## 2017-04-10 MED ORDER — AMLODIPINE BESYLATE 5 MG PO TABS: 5.0000 mg | ORAL_TABLET | Freq: Every day | ORAL | 0 refills | Status: DC

## 2017-05-12 ENCOUNTER — Other Ambulatory Visit: Payer: Self-pay | Admitting: Family Medicine

## 2017-06-08 ENCOUNTER — Other Ambulatory Visit: Payer: Self-pay | Admitting: Family Medicine

## 2017-07-19 ENCOUNTER — Encounter: Payer: Self-pay | Admitting: Family Medicine

## 2017-07-19 ENCOUNTER — Ambulatory Visit (INDEPENDENT_AMBULATORY_CARE_PROVIDER_SITE_OTHER): Payer: 59 | Admitting: Family Medicine

## 2017-07-19 VITALS — BP 114/76 | HR 94 | Temp 97.8°F | Ht 65.0 in | Wt 205.8 lb

## 2017-07-19 DIAGNOSIS — I10 Essential (primary) hypertension: Secondary | ICD-10-CM | POA: Diagnosis not present

## 2017-07-19 DIAGNOSIS — E039 Hypothyroidism, unspecified: Secondary | ICD-10-CM | POA: Diagnosis not present

## 2017-07-19 DIAGNOSIS — Z23 Encounter for immunization: Secondary | ICD-10-CM | POA: Diagnosis not present

## 2017-07-19 NOTE — Progress Notes (Signed)
Patient ID: Teresa Nixon, female    DOB: December 25, 1960  Age: 56 y.o. MRN: [id number]    Subjective:  Subjective  HPI Virdie Penning presents for f/u thyroid and bp.  No complaints.    Review of Systems  Constitutional: Negative for activity change, appetite change, fatigue and unexpected weight change.  Respiratory: Negative for cough and shortness of breath.   Cardiovascular: Negative for chest pain and palpitations.  Psychiatric/Behavioral: Negative for behavioral problems and dysphoric mood. The patient is not nervous/anxious.     History Past Medical History:  Diagnosis Date  . Depression   . Hypertension   . Thyroid disease    hypothyroid    She has a past surgical history that includes Gastric bypass (2001); Breast reduction surgery (10/2003); and Abdominoplasty (03/2007).   Her family history includes Breast cancer in her paternal aunt; Cancer in her father and paternal grandmother; Coronary artery disease in her paternal grandfather; Diabetes in her father; Hypertension in her mother; Stroke in her father.She reports that she has never smoked. She has never used smokeless tobacco. She reports that she drinks alcohol. She reports that she does not use drugs.  Current Outpatient Prescriptions on File Prior to Visit  Medication Sig Dispense Refill  . ARIPiprazole (ABILIFY) 2 MG tablet Take 1 tablet by mouth daily.    Marland Kitchen BRINTELLIX 20 MG TABS Take 20 mg by mouth daily.    . Calcium-Vitamin D (CALTRATE 600 PLUS-VIT D PO) Take 1 tablet by mouth daily.    . Cranberry 1000 MG CAPS Take 2 capsules by mouth daily.     . Cyanocobalamin (VITAMIN B 12 PO) Take 1,000 mcg by mouth once a week.      . hydrOXYzine (VISTARIL) 50 MG capsule Take 1-2 capsules by mouth as needed.    Marland Kitchen levothyroxine (SYNTHROID, LEVOTHROID) 137 MCG tablet TAKE ONE TABLET BY MOUTH EVERY DAY 30 tablet 10  . loratadine (CLARITIN) 10 MG tablet Take 10 mg by mouth daily.      . Multiple Vitamin (MULTIVITAMIN) tablet Take 1  tablet by mouth daily.    Marland Kitchen amLODipine (NORVASC) 5 MG tablet TAKE ONE TABLET BY MOUTH DAILY (MAKE APPOINTMENT FOR FURTHER REFILLS) (Patient not taking: Reported on 07/19/2017) 30 tablet 0   No current facility-administered medications on file prior to visit.      Objective:  Objective  Physical Exam  Constitutional: She is oriented to person, place, and time. She appears well-developed and well-nourished.  HENT:  Head: Normocephalic and atraumatic.  Eyes: Conjunctivae and EOM are normal.  Neck: Normal range of motion. Neck supple. No JVD present. Carotid bruit is not present. No thyromegaly present.  Cardiovascular: Normal rate, regular rhythm and normal heart sounds.   No murmur heard. Pulmonary/Chest: Effort normal and breath sounds normal. No respiratory distress. She has no wheezes. She has no rales. She exhibits no tenderness.  Musculoskeletal: She exhibits no edema.  Neurological: She is alert and oriented to person, place, and time.  Psychiatric: She has a normal mood and affect. Her behavior is normal. Judgment and thought content normal.  Nursing note and vitals reviewed.  BP 114/76 (BP Location: Right Arm, Patient Position: Sitting, Cuff Size: Normal)   Pulse 94   Temp 97.8 F (36.6 C) (Oral)   Ht 5\' 5"  (1.651 m)   Wt 205 lb 12.8 oz (93.4 kg)   BMI 34.25 kg/m  Wt Readings from Last 3 Encounters:  07/19/17 205 lb 12.8 oz (93.4 kg)  06/01/16 207 lb 6.4  oz (94.1 kg)  03/17/15 210 lb 3.2 oz (95.3 kg)     Lab Results  Component Value Date   WBC 6.5 04/08/2013   HGB 13.7 04/08/2013   HCT 41.1 04/08/2013   PLT 344.0 04/08/2013   GLUCOSE 95 06/01/2016   CHOL 234 (H) 06/01/2016   TRIG 144.0 06/01/2016   HDL 75.00 06/01/2016   LDLDIRECT 133.6 04/08/2013   LDLCALC 130 (H) 06/01/2016   ALT 17 06/01/2016   AST 17 06/01/2016   NA 141 06/01/2016   K 4.8 06/01/2016   CL 107 06/01/2016   CREATININE 0.81 06/01/2016   BUN 15 06/01/2016   CO2 27 06/01/2016   TSH 1.59  06/01/2016    No results found.   Assessment & Plan:  Plan  I am having Ms. Cerezo maintain her loratadine, Cranberry, Cyanocobalamin (VITAMIN B 12 PO), multivitamin, BRINTELLIX, ARIPiprazole, hydrOXYzine, Calcium-Vitamin D (CALTRATE 600 PLUS-VIT D PO), levothyroxine, and amLODipine.  No orders of the defined types were placed in this encounter.   Problem List Items Addressed This Visit      Unprioritized   Hypothyroidism - Primary   Relevant Orders   TSH    Other Visit Diagnoses    Essential hypertension       Relevant Orders   TSH   Lipid panel   CBC with Differential/Platelet   Comprehensive metabolic panel      Follow-up: Return in about 4 weeks (around 08/16/2017) for hypertension.  Ann Held, DO

## 2017-07-19 NOTE — Patient Instructions (Signed)

## 2017-07-21 NOTE — Assessment & Plan Note (Signed)
Check labs con't meds 

## 2017-07-21 NOTE — Assessment & Plan Note (Addendum)
Well controlled, . Encouraged heart healthy diet such as the DASH diet and exercise as tolerated.  Pt is off meds--- rto 3 months or sooner if bp starts to go up again

## 2018-02-10 ENCOUNTER — Ambulatory Visit: Payer: 59 | Admitting: Family Medicine

## 2018-02-10 ENCOUNTER — Encounter: Payer: Self-pay | Admitting: Family Medicine

## 2018-02-10 VITALS — BP 138/82 | HR 68 | Temp 97.7°F | Resp 16 | Ht 66.0 in | Wt 195.8 lb

## 2018-02-10 DIAGNOSIS — I10 Essential (primary) hypertension: Secondary | ICD-10-CM

## 2018-02-10 DIAGNOSIS — M79671 Pain in right foot: Secondary | ICD-10-CM | POA: Insufficient documentation

## 2018-02-10 DIAGNOSIS — M79645 Pain in left finger(s): Secondary | ICD-10-CM

## 2018-02-10 DIAGNOSIS — Z0000 Encounter for general adult medical examination without abnormal findings: Secondary | ICD-10-CM | POA: Diagnosis not present

## 2018-02-10 DIAGNOSIS — E039 Hypothyroidism, unspecified: Secondary | ICD-10-CM

## 2018-02-10 DIAGNOSIS — M109 Gout, unspecified: Secondary | ICD-10-CM | POA: Diagnosis not present

## 2018-02-10 LAB — LIPID PANEL
Cholesterol: 171 mg/dL (ref 0–200)
HDL: 49.8 mg/dL (ref >39.00)
LDL Cholesterol: 99 mg/dL (ref 0–99)
NONHDL: 121.47
Total CHOL/HDL Ratio: 3
Triglycerides: 111 mg/dL (ref 0.0–149.0)
VLDL: 22.2 mg/dL (ref 0.0–40.0)

## 2018-02-10 LAB — COMPREHENSIVE METABOLIC PANEL
ALT: 32 U/L (ref 0–35)
AST: 30 U/L (ref 0–37)
Albumin: 3.5 g/dL (ref 3.5–5.2)
Alkaline Phosphatase: 84 U/L (ref 39–117)
BUN: 7 mg/dL (ref 6–23)
CHLORIDE: 108 meq/L (ref 96–112)
CO2: 24 mEq/L (ref 19–32)
CREATININE: 0.68 mg/dL (ref 0.40–1.20)
Calcium: 9.2 mg/dL (ref 8.4–10.5)
GFR: 94.55 mL/min (ref >60.00)
GLUCOSE: 99 mg/dL (ref 70–99)
POTASSIUM: 3.8 meq/L (ref 3.5–5.1)
SODIUM: 139 meq/L (ref 135–145)
TOTAL PROTEIN: 7.1 g/dL (ref 6.0–8.3)
Total Bilirubin: 0.3 mg/dL (ref 0.2–1.2)

## 2018-02-10 LAB — URIC ACID: Uric Acid, Serum: 8.1 mg/dL — ABNORMAL HIGH (ref 2.4–7.0)

## 2018-02-10 LAB — TSH: TSH: 1.88 u[IU]/mL (ref 0.35–4.50)

## 2018-02-10 MED ORDER — MELOXICAM 7.5 MG PO TABS: ORAL_TABLET | ORAL | 1 refills | Status: DC

## 2018-02-10 NOTE — Assessment & Plan Note (Signed)
R/o gout Check labs Labs/ xray reviewed from [facility] for pain

## 2018-02-10 NOTE — Assessment & Plan Note (Signed)
Sl inc today-- pt is c/o pain rto for bp check 2-3 weeks with bp cuff Call before if bp running higher at home

## 2018-02-10 NOTE — Patient Instructions (Signed)

## 2018-02-10 NOTE — Progress Notes (Signed)
Subjective:  I acted as a Education administrator for [employer]. Teresa Nixon, [place]   Patient ID: Teresa Nixon, female    DOB: 11-24-1960, 57 y.o.   MRN: [id number]  Chief Complaint  Patient presents with  . hand swelling  . Foot Swelling    HPI  Patient is in today for swollen left hand-- index finger, and right foot.---  Over metatarsals  She has stopped etoh all together since [facility] to [facility]--- cmp and cbc were normal--- we reviewed them in care everywhere.   [facility] called her and said they thought it may be gout or arthritis.  She was at a party 2 weeks ago and drank way too much wine---- woke up next day and she could not walk on her r foot and her left finger was swollen .    It is better since stopping etoh but it still hurts to walk and finger still painful.    Patient Care Team: Carollee Herter, Alferd Apa, DO as PCP - General (Family Medicine)   Past Medical History:  Diagnosis Date  . Depression   . Hypertension   . Thyroid disease    hypothyroid    Past Surgical History:  Procedure Laterality Date  . ABDOMINOPLASTY  03/2007  . BREAST REDUCTION SURGERY  10/2003  . GASTRIC BYPASS  2001    Family History  Problem Relation Age of Onset  . Hypertension Mother   . Diabetes Father   . Stroke Father   . Cancer Father        Bladder Cancer  . Coronary artery disease Paternal Grandfather   . Breast cancer Paternal Aunt   . Cancer Paternal Grandmother        Bladder Cancer    Social History   Socioeconomic History  . Marital status: Divorced    Spouse name: Not on file  . Number of children: Not on file  . Years of education: Not on file  . Highest education level: Not on file  Occupational History  . Occupation: Public house manager  Social Needs  . Financial resource strain: Not on file  . Food insecurity:    Worry: Not on file    Inability: Not on file  . Transportation needs:    Medical: Not on file    Non-medical: Not on file  Tobacco Use  . Smoking  status: Never Smoker  . Smokeless tobacco: Never Used  Substance and Sexual Activity  . Alcohol use: Yes  . Drug use: No  . Sexual activity: Not Currently    Partners: Male    Comment: single  Lifestyle  . Physical activity:    Days per week: Not on file    Minutes per session: Not on file  . Stress: Not on file  Relationships  . Social connections:    Talks on phone: Not on file    Gets together: Not on file    Attends religious service: Not on file    Active member of club or organization: Not on file    Attends meetings of clubs or organizations: Not on file    Relationship status: Not on file  . Intimate partner violence:    Fear of current or ex partner: Not on file    Emotionally abused: Not on file    Physically abused: Not on file    Forced sexual activity: Not on file  Other Topics Concern  . Not on file  Social History Narrative   Exercise--walks 4000 steps  Outpatient Medications Prior to Visit  Medication Sig Dispense Refill  . amLODipine (NORVASC) 5 MG tablet TAKE ONE TABLET BY MOUTH DAILY (MAKE APPOINTMENT FOR FURTHER REFILLS) 30 tablet 0  . ARIPiprazole (ABILIFY) 2 MG tablet Take 1 tablet by mouth daily.    Marland Kitchen BRINTELLIX 20 MG TABS Take 20 mg by mouth daily.    . Cyanocobalamin (VITAMIN B 12 PO) Take 1,000 mcg by mouth once a week.      . hydrOXYzine (VISTARIL) 50 MG capsule Take 1-2 capsules by mouth as needed.    Marland Kitchen levothyroxine (SYNTHROID, LEVOTHROID) 137 MCG tablet TAKE ONE TABLET BY MOUTH EVERY DAY 30 tablet 10  . loratadine (CLARITIN) 10 MG tablet Take 10 mg by mouth daily.      . Multiple Vitamin (MULTIVITAMIN) tablet Take 1 tablet by mouth daily.    . Calcium-Vitamin D (CALTRATE 600 PLUS-VIT D PO) Take 1 tablet by mouth daily.    . Cranberry 1000 MG CAPS Take 2 capsules by mouth daily.      No facility-administered medications prior to visit.     No Known Allergies  Review of Systems  Constitutional: Negative for chills, fever and  malaise/fatigue.  HENT: Negative for congestion and hearing loss.   Eyes: Negative for discharge.  Respiratory: Negative for cough, sputum production and shortness of breath.   Cardiovascular: Negative for chest pain, palpitations and leg swelling.  Gastrointestinal: Negative for abdominal pain, blood in stool, constipation, diarrhea, heartburn, nausea and vomiting.  Genitourinary: Negative for dysuria, frequency, hematuria and urgency.  Musculoskeletal: Positive for joint pain. Negative for back pain, falls and myalgias.  Skin: Negative for rash.  Neurological: Negative for dizziness, sensory change, loss of consciousness, weakness and headaches.  Endo/Heme/Allergies: Negative for environmental allergies. Does not bruise/bleed easily.  Psychiatric/Behavioral: Negative for depression and suicidal ideas. The patient is not nervous/anxious and does not have insomnia.        Objective:    Physical Exam  Constitutional: She is oriented to person, place, and time. She appears well-developed and well-nourished.  HENT:  Head: Normocephalic and atraumatic.  Eyes: Conjunctivae and EOM are normal.  Neck: Normal range of motion. Neck supple. No JVD present. Carotid bruit is not present. No thyromegaly present.  Cardiovascular: Normal rate, regular rhythm and normal heart sounds.  No murmur heard. Pulmonary/Chest: Effort normal and breath sounds normal. No respiratory distress. She has no wheezes. She has no rales. She exhibits no tenderness.  Musculoskeletal: She exhibits edema and tenderness.       Left hand: She exhibits tenderness and swelling. Normal sensation noted. Normal strength noted.       Hands:      Right foot: There is tenderness and swelling.       Feet:  Neurological: She is alert and oriented to person, place, and time.  Psychiatric: She has a normal mood and affect.  Nursing note and vitals reviewed.   BP 138/82 (BP Location: Left Arm, Patient Position: Sitting, Cuff Size:  Large)   Pulse 68   Temp 97.7 F (36.5 C) (Oral)   Resp 16   Ht 5\' 6"  (1.676 m)   Wt 195 lb 12.8 oz (88.8 kg)   BMI 31.60 kg/m  Wt Readings from Last 3 Encounters:  02/10/18 195 lb 12.8 oz (88.8 kg)  07/19/17 205 lb 12.8 oz (93.4 kg)  06/01/16 207 lb 6.4 oz (94.1 kg)   BP Readings from Last 3 Encounters:  02/10/18 138/82  07/19/17 114/76  06/01/16 127/84  Immunization History  Administered Date(s) Administered  . Influenza Whole 08/17/2009  . Influenza, Seasonal, Injecte, Preservative Fre 08/24/2015  . Influenza,inj,Quad PF,6+ Mos 09/05/2014, 07/19/2017  . Td 10/31/2009    Health Maintenance  Topic Date Due  . Hepatitis C Screening  06/11/1960  . HIV Screening  05/30/1975  . MAMMOGRAM  12/15/2016  . COLONOSCOPY  02/17/2017  . PAP SMEAR  12/15/2017  . INFLUENZA VACCINE  06/12/2018  . TETANUS/TDAP  11/01/2019    Lab Results  Component Value Date   WBC 6.5 04/08/2013   HGB 13.7 04/08/2013   HCT 41.1 04/08/2013   PLT 344.0 04/08/2013   GLUCOSE 95 06/01/2016   CHOL 234 (H) 06/01/2016   TRIG 144.0 06/01/2016   HDL 75.00 06/01/2016   LDLDIRECT 133.6 04/08/2013   LDLCALC 130 (H) 06/01/2016   ALT 17 06/01/2016   AST 17 06/01/2016   NA 141 06/01/2016   K 4.8 06/01/2016   CL 107 06/01/2016   CREATININE 0.81 06/01/2016   BUN 15 06/01/2016   CO2 27 06/01/2016   TSH 1.59 06/01/2016    Lab Results  Component Value Date   TSH 1.59 06/01/2016   Lab Results  Component Value Date   WBC 6.5 04/08/2013   HGB 13.7 04/08/2013   HCT 41.1 04/08/2013   MCV 89.0 04/08/2013   PLT 344.0 04/08/2013   Lab Results  Component Value Date   NA 141 06/01/2016   K 4.8 06/01/2016   CO2 27 06/01/2016   GLUCOSE 95 06/01/2016   BUN 15 06/01/2016   CREATININE 0.81 06/01/2016   BILITOT 0.3 06/01/2016   ALKPHOS 102 06/01/2016   AST 17 06/01/2016   ALT 17 06/01/2016   PROT 7.5 06/01/2016   ALBUMIN 4.0 06/01/2016   CALCIUM 9.4 06/01/2016   GFR 77.74 06/01/2016   Lab  Results  Component Value Date   CHOL 234 (H) 06/01/2016   Lab Results  Component Value Date   HDL 75.00 06/01/2016   Lab Results  Component Value Date   LDLCALC 130 (H) 06/01/2016   Lab Results  Component Value Date   TRIG 144.0 06/01/2016   Lab Results  Component Value Date   CHOLHDL 3 06/01/2016   No results found for: HGBA1C       Assessment & Plan:   Problem List Items Addressed This Visit      Unprioritized   Finger pain, left    R/o gout Check labs Labs/ xray reviewed from [facility] mobic for pain      Relevant Medications   meloxicam (MOBIC) 7.5 MG tablet   Other Relevant Orders   Rheumatoid Factor   HYPERTENSION, BENIGN ESSENTIAL    Sl inc today-- pt is c/o pain rto for bp check 2-3 weeks with bp cuff Call before if bp running higher at home      Hypothyroidism   Relevant Orders   TSH   Pain of right foot    Suspect gout Pt given ho on diet Pt has stopped etoh mobic Check labs       Relevant Medications   meloxicam (MOBIC) 7.5 MG tablet    Other Visit Diagnoses    Acute gout of multiple sites, unspecified cause    -  Primary   Relevant Medications   meloxicam (MOBIC) 7.5 MG tablet   Other Relevant Orders   Uric acid   Preventative health care       Essential hypertension       Relevant Orders   Lipid  panel   Comprehensive metabolic panel      I am having Lowana Nanna start on meloxicam. I am also having her maintain her loratadine, Cranberry, Cyanocobalamin (VITAMIN B 12 PO), multivitamin, BRINTELLIX, ARIPiprazole, hydrOXYzine, Calcium-Vitamin D (CALTRATE 600 PLUS-VIT D PO), levothyroxine, and amLODipine.  Meds ordered this encounter  Medications  . meloxicam (MOBIC) 7.5 MG tablet    Sig: 1-2 po qd prn    Dispense:  30 tablet    Refill:  1    CMA served as scribe during this visit. History, Physical and Plan performed by medical provider. Documentation and orders reviewed and attested to.  Ann Held, DO

## 2018-02-10 NOTE — Assessment & Plan Note (Signed)
Suspect gout Pt given ho on diet Pt has stopped etoh mobic Check labs

## 2018-02-11 LAB — RHEUMATOID FACTOR: Rhuematoid fact SerPl-aCnc: <14 IU/mL (ref <14)

## 2018-02-12 ENCOUNTER — Telehealth: Payer: Self-pay | Admitting: Family Medicine

## 2018-02-12 NOTE — Telephone Encounter (Signed)
Copied from [place] [id number]. Topic: Quick Communication - Rx Refill/Question >> Feb 12, 2018  6:58 PM Waylan Rocher, [place] L wrote: Medication: Allopurinol (patient says it was supposed to be called in after her last appt but wasn't) Has the patient contacted their pharmacy? Yes.   (Agent: If no, request that the patient contact the pharmacy for the refill.) Preferred Pharmacy (with phone number or street name): [facility] [place], [place] - [address] [place] [address] Phone: [phone] Fax: [phone] Agent: Please be advised that RX refills may take up to 3 business days. We ask that you follow-up with your pharmacy.

## 2018-02-13 ENCOUNTER — Encounter: Payer: Self-pay | Admitting: Family Medicine

## 2018-02-13 ENCOUNTER — Other Ambulatory Visit: Payer: Self-pay

## 2018-02-13 DIAGNOSIS — M79671 Pain in right foot: Secondary | ICD-10-CM

## 2018-02-13 DIAGNOSIS — M79645 Pain in left finger(s): Secondary | ICD-10-CM

## 2018-02-13 MED ORDER — ALLOPURINOL 100 MG PO TABS: 100.0000 mg | ORAL_TABLET | Freq: Every day | ORAL | 2 refills | Status: DC

## 2018-02-13 NOTE — Telephone Encounter (Signed)
Rx for Allopurinol sent to pharmacy as indicated by pcp on Uric Acid results.

## 2018-02-14 MED ORDER — ALLOPURINOL 100 MG PO TABS: 100.0000 mg | ORAL_TABLET | Freq: Every day | ORAL | 0 refills | Status: DC

## 2018-02-14 MED ORDER — MELOXICAM 7.5 MG PO TABS: ORAL_TABLET | ORAL | 1 refills | Status: DC

## 2018-05-20 ENCOUNTER — Other Ambulatory Visit: Payer: Self-pay | Admitting: Family Medicine

## 2018-06-29 ENCOUNTER — Other Ambulatory Visit: Payer: Self-pay | Admitting: Family Medicine

## 2018-06-29 DIAGNOSIS — M79671 Pain in right foot: Secondary | ICD-10-CM

## 2018-06-29 DIAGNOSIS — M79645 Pain in left finger(s): Secondary | ICD-10-CM

## 2018-08-26 ENCOUNTER — Other Ambulatory Visit: Payer: Self-pay | Admitting: Family Medicine

## 2018-11-28 ENCOUNTER — Other Ambulatory Visit: Payer: Self-pay | Admitting: Family Medicine

## 2018-11-28 MED ORDER — ALLOPURINOL 100 MG PO TABS: 100.0000 mg | ORAL_TABLET | Freq: Every day | ORAL | 5 refills | Status: DC

## 2018-12-02 ENCOUNTER — Encounter: Payer: Self-pay | Admitting: Family Medicine

## 2018-12-03 NOTE — Telephone Encounter (Signed)
She needs ov 

## 2018-12-08 ENCOUNTER — Ambulatory Visit: Payer: 59 | Admitting: Family Medicine

## 2018-12-08 ENCOUNTER — Encounter: Payer: Self-pay | Admitting: Family Medicine

## 2018-12-08 VITALS — BP 108/60 | HR 93 | Temp 97.7°F | Ht 66.0 in | Wt 197.0 lb

## 2018-12-08 DIAGNOSIS — M10072 Idiopathic gout, left ankle and foot: Secondary | ICD-10-CM | POA: Diagnosis not present

## 2018-12-08 LAB — CBC WITH DIFFERENTIAL/PLATELET
BASOS PCT: 1.2 % (ref 0.0–3.0)
Basophils Absolute: 0.1 10*3/uL (ref 0.0–0.1)
EOS ABS: 0.1 10*3/uL (ref 0.0–0.7)
Eosinophils Relative: 1.9 % (ref 0.0–5.0)
HCT: 39.1 % (ref 36.0–46.0)
Hemoglobin: 13.3 g/dL (ref 12.0–15.0)
Lymphocytes Relative: 17.8 % (ref 12.0–46.0)
Lymphs Abs: 1.1 10*3/uL (ref 0.7–4.0)
MCHC: 34 g/dL (ref 30.0–36.0)
MCV: 92.3 fl (ref 78.0–100.0)
Monocytes Absolute: 0.4 10*3/uL (ref 0.1–1.0)
Monocytes Relative: 6.2 % (ref 3.0–12.0)
Neutro Abs: 4.4 10*3/uL (ref 1.4–7.7)
Neutrophils Relative %: 72.9 % (ref 43.0–77.0)
Platelets: 551 10*3/uL — ABNORMAL HIGH (ref 150.0–400.0)
RBC: 4.24 Mil/uL (ref 3.87–5.11)
RDW: 12.6 % (ref 11.5–15.5)
WBC: 6 10*3/uL (ref 4.0–10.5)

## 2018-12-08 LAB — COMPREHENSIVE METABOLIC PANEL
ALK PHOS: 125 U/L — AB (ref 39–117)
ALT: 34 U/L (ref 0–35)
AST: 32 U/L (ref 0–37)
Albumin: 3.4 g/dL — ABNORMAL LOW (ref 3.5–5.2)
BUN: 7 mg/dL (ref 6–23)
CALCIUM: 9.2 mg/dL (ref 8.4–10.5)
CO2: 26 mEq/L (ref 19–32)
CREATININE: 0.68 mg/dL (ref 0.40–1.20)
Chloride: 106 mEq/L (ref 96–112)
GFR: 88.71 mL/min (ref >60.00)
GLUCOSE: 115 mg/dL — AB (ref 70–99)
Potassium: 3.9 mEq/L (ref 3.5–5.1)
Sodium: 142 mEq/L (ref 135–145)
Total Bilirubin: 0.3 mg/dL (ref 0.2–1.2)
Total Protein: 6.8 g/dL (ref 6.0–8.3)

## 2018-12-08 LAB — URIC ACID: Uric Acid, Serum: 5.3 mg/dL (ref 2.4–7.0)

## 2018-12-08 MED ORDER — COLCHICINE 0.6 MG PO TABS: ORAL_TABLET | ORAL | 1 refills | Status: AC

## 2018-12-08 NOTE — Patient Instructions (Signed)
Gout    Gout is a condition that causes painful swelling of the joints. Gout is a type of inflammation of the joints (arthritis). This condition is caused by having too much uric acid in the body. Uric acid is a chemical that forms when the body breaks down substances called purines. Purines are important for building body proteins.  When the body has too much uric acid, sharp crystals can form and build up inside the joints. This causes pain and swelling. Gout attacks can happen quickly and may be very painful (acute gout). Over time, the attacks can affect more joints and become more frequent (chronic gout). Gout can also cause uric acid to build up under the skin and inside the kidneys.  What are the causes?  This condition is caused by too much uric acid in your blood. This can happen because:   Your kidneys do not remove enough uric acid from your blood. This is the most common cause.   Your body makes too much uric acid. This can happen with some cancers and cancer treatments. It can also occur if your body is breaking down too many red blood cells (hemolytic anemia).   You eat too many foods that are high in purines. These foods include organ meats and some seafood. Alcohol, especially beer, is also high in purines.  A gout attack may be triggered by trauma or stress.  What increases the risk?  You are more likely to develop this condition if you:   Have a family history of gout.   Are female and middle-aged.   Are female and have gone through menopause.   Are obese.   Frequently drink alcohol, especially beer.   Are dehydrated.   Lose weight too quickly.   Have an organ transplant.   Have lead poisoning.   Take certain medicines, including aspirin, cyclosporine, diuretics, levodopa, and niacin.   Have kidney disease.   Have a skin condition called psoriasis.  What are the signs or symptoms?  An attack of acute gout happens quickly. It usually occurs in just one joint. The most common place is  the big toe. Attacks often start at night. Other joints that may be affected include joints of the feet, ankle, knee, fingers, wrist, or elbow. Symptoms of this condition may include:   Severe pain.   Warmth.   Swelling.   Stiffness.   Tenderness. The affected joint may be very painful to touch.   Shiny, red, or purple skin.   Chills and fever.  Chronic gout may cause symptoms more frequently. More joints may be involved. You may also have white or yellow lumps (tophi) on your hands or feet or in other areas near your joints.  How is this diagnosed?  This condition is diagnosed based on your symptoms, medical history, and physical exam. You may have tests, such as:   Blood tests to measure uric acid levels.   Removal of joint fluid with a thin needle (aspiration) to look for uric acid crystals.   X-rays to look for joint damage.  How is this treated?  Treatment for this condition has two phases: treating an acute attack and preventing future attacks. Acute gout treatment may include medicines to reduce pain and swelling, including:   NSAIDs.   Steroids. These are strong anti-inflammatory medicines that can be taken by mouth (orally) or injected into a joint.   Colchicine. This medicine relieves pain and swelling when it is taken soon after an attack. It   can be given by mouth or through an IV.  Preventive treatment may include:   Daily use of smaller doses of NSAIDs or colchicine.   Use of a medicine that reduces uric acid levels in your blood.   Changes to your diet. You may need to see a dietitian about what to eat and drink to prevent gout.  Follow these instructions at home:  During a gout attack     If directed, put ice on the affected area:  ? Put ice in a plastic bag.  ? Place a towel between your skin and the bag.  ? Leave the ice on for 20 minutes, 2-3 times a day.   Raise (elevate) the affected joint above the level of your heart as often as possible.   Rest the joint as much as possible.  If the affected joint is in your leg, you may be given crutches to use.   Follow instructions from your health care provider about eating or drinking restrictions.  Avoiding future gout attacks   Follow a low-purine diet as told by your dietitian or health care provider. Avoid foods and drinks that are high in purines, including liver, kidney, anchovies, asparagus, herring, mushrooms, mussels, and beer.   Maintain a healthy weight or lose weight if you are overweight. If you want to lose weight, talk with your health care provider. It is important that you do not lose weight too quickly.   Start or maintain an exercise program as told by your health care provider.  Eating and drinking   Drink enough fluids to keep your urine pale yellow.   If you drink alcohol:  ? Limit how much you use to:   0-1 drink a day for women.   0-2 drinks a day for men.  ? Be aware of how much alcohol is in your drink. In the [place] one drink equals one 12 oz bottle of beer (355 mL) one 5 oz glass of wine (148 mL), or one 1 oz glass of hard liquor (44 mL).  General instructions   Take over-the-counter and prescription medicines only as told by your health care provider.   Do not drive or use heavy machinery while taking prescription pain medicine.   Return to your normal activities as told by your health care provider. Ask your health care provider what activities are safe for you.   Keep all follow-up visits as told by your health care provider. This is important.  Contact a health care provider if you have:   Another gout attack.   Continuing symptoms of a gout attack after 10 days of treatment.   Side effects from your medicines.   Chills or a fever.   Burning pain when you urinate.   Pain in your lower back or belly.  Get help right away if you:   Have severe or uncontrolled pain.   Cannot urinate.  Summary   Gout is painful swelling of the joints caused by inflammation.   The most common site of pain is the big  toe, but it can affect other joints in the body.   Medicines and dietary changes can help to prevent and treat gout attacks.  This information is not intended to replace advice given to you by your health care provider. Make sure you discuss any questions you have with your health care provider.  Document Released: 10/26/2000 Document Revised: 05/21/2018 Document Reviewed: 05/21/2018  Elsevier Interactive Patient Education  2019 [employer]

## 2018-12-08 NOTE — Progress Notes (Signed)
Patient ID: Teresa Nixon, female    DOB: 1961-01-23  Age: 58 y.o. MRN: [id number]    Subjective:  Subjective  HPI Teresa Nixon presents for pain in L Foot -- front part   Pt stopped the allopurinol because she ran out in Oct.  She started taking the allopurinol last week but foot is still tender.  The entire foot did hurt so it is better but not completely.   She is using a scooter to stay off of it.   Review of Systems  Constitutional: Negative for appetite change, diaphoresis, fatigue and unexpected weight change.  Eyes: Negative for pain, redness and visual disturbance.  Respiratory: Negative for cough, chest tightness, shortness of breath and wheezing.   Cardiovascular: Negative for chest pain, palpitations and leg swelling.  Endocrine: Negative for cold intolerance, heat intolerance, polydipsia, polyphagia and polyuria.  Genitourinary: Negative for difficulty urinating, dysuria and frequency.  Musculoskeletal: Positive for gait problem and joint swelling.  Neurological: Negative for dizziness, light-headedness, numbness and headaches.    History Past Medical History:  Diagnosis Date  . Depression   . Hypertension   . Thyroid disease    hypothyroid    She has a past surgical history that includes Gastric bypass (2001); Breast reduction surgery (10/2003); and Abdominoplasty (03/2007).   Her family history includes Breast cancer in her paternal aunt; Cancer in her father and paternal grandmother; Coronary artery disease in her paternal grandfather; Diabetes in her father; Hypertension in her mother; Stroke in her father.She reports that she has never smoked. She has never used smokeless tobacco. She reports current alcohol use. She reports that she does not use drugs.  Current Outpatient Medications on File Prior to Visit  Medication Sig Dispense Refill  . allopurinol (ZYLOPRIM) 100 MG tablet Take 1 tablet (100 mg total) by mouth daily. 30 tablet 5  . ARIPiprazole (ABILIFY) 2 MG  tablet Take 1 tablet by mouth daily.    Marland Kitchen BRINTELLIX 20 MG TABS Take 20 mg by mouth daily.    . Cranberry 1000 MG CAPS Take 2 capsules by mouth daily.     . Cyanocobalamin (VITAMIN B 12 PO) Take 1,000 mcg by mouth once a week.      . hydrOXYzine (VISTARIL) 50 MG capsule Take 1-2 capsules by mouth as needed.    Marland Kitchen levothyroxine (SYNTHROID, LEVOTHROID) 137 MCG tablet TAKE ONE TABLET BY MOUTH EVERY DAY 30 tablet 10  . loratadine (CLARITIN) 10 MG tablet Take 10 mg by mouth daily.      . meloxicam (MOBIC) 7.5 MG tablet TAKE ONE TO TWO TABLETS BY MOUTH EVERY DAY AS NEEDED 30 tablet 0  . Multiple Vitamin (MULTIVITAMIN) tablet Take 1 tablet by mouth daily.    . Calcium-Vitamin D (CALTRATE 600 PLUS-VIT D PO) Take 1 tablet by mouth daily.     No current facility-administered medications on file prior to visit.      Objective:  Objective  Physical Exam Vitals signs and nursing note reviewed.  Constitutional:      Appearance: She is well-developed.  HENT:     Head: Normocephalic and atraumatic.  Eyes:     Conjunctiva/sclera: Conjunctivae normal.  Neck:     Musculoskeletal: Normal range of motion and neck supple.     Thyroid: No thyromegaly.     Vascular: No carotid bruit or JVD.  Cardiovascular:     Rate and Rhythm: Normal rate and regular rhythm.     Heart sounds: Normal heart sounds. No murmur.  Pulmonary:  Effort: Pulmonary effort is normal. No respiratory distress.     Breath sounds: Normal breath sounds. No wheezing or rales.  Chest:     Chest wall: No tenderness.  Musculoskeletal:        General: Swelling and tenderness present.     Left foot: Tenderness and swelling present.       Feet:  Neurological:     Mental Status: She is alert and oriented to person, place, and time.    BP 108/60   Pulse 93   Temp 97.7 F (36.5 C)   Ht 5\' 6"  (1.676 m)   Wt 197 lb (89.4 kg)   SpO2 98%   BMI 31.80 kg/m  Wt Readings from Last 3 Encounters:  12/08/18 197 lb (89.4 kg)  02/10/18  195 lb 12.8 oz (88.8 kg)  07/19/17 205 lb 12.8 oz (93.4 kg)     Lab Results  Component Value Date   WBC 6.0 12/08/2018   HGB 13.3 12/08/2018   HCT 39.1 12/08/2018   PLT 551.0 (H) 12/08/2018   GLUCOSE 115 (H) 12/08/2018   CHOL 171 02/10/2018   TRIG 111.0 02/10/2018   HDL 49.80 02/10/2018   LDLDIRECT 133.6 04/08/2013   LDLCALC 99 02/10/2018   ALT 34 12/08/2018   AST 32 12/08/2018   NA 142 12/08/2018   K 3.9 12/08/2018   CL 106 12/08/2018   CREATININE 0.68 12/08/2018   BUN 7 12/08/2018   CO2 26 12/08/2018   TSH 1.88 02/10/2018    No results found.   Assessment & Plan:  Plan  I have discontinued Teresa Nixon's amLODipine. I am also having her start on colchicine. Additionally, I am having her maintain her loratadine, Cranberry, Cyanocobalamin (VITAMIN B 12 PO), multivitamin, BRINTELLIX, ARIPiprazole, hydrOXYzine, Calcium-Vitamin D (CALTRATE 600 PLUS-VIT D PO), levothyroxine, meloxicam, and allopurinol.  Meds ordered this encounter  Medications  . colchicine 0.6 MG tablet    Sig: 2 tabs po once then 1 tab po q 2 hours prn pain til pain gone, max of 6 tabs in 24 hours or intolerable diarrhea    Dispense:  6 tablet    Refill:  1    Problem List Items Addressed This Visit    None    Visit Diagnoses    Acute idiopathic gout involving toe of left foot    -  Primary   Relevant Medications   colchicine 0.6 MG tablet   Other Relevant Orders   Comprehensive metabolic panel (Completed)   Uric acid (Completed)   CBC with Differential/Platelet (Completed)    CON'T ALLOPURINOL CHECK URIC ACID-- DOSE MAY NEED TO BE INC FOOT/ TOE IS NOT HOT OR ERRYTHEMATOUS-- NO ABX NEEDED CON'T MOBIC PRN   Follow-up: No follow-ups on file.  Ann Held, DO

## 2019-04-28 ENCOUNTER — Other Ambulatory Visit: Payer: Self-pay | Admitting: Family Medicine

## 2019-04-30 ENCOUNTER — Other Ambulatory Visit: Payer: Self-pay | Admitting: Family Medicine

## 2019-04-30 DIAGNOSIS — M79645 Pain in left finger(s): Secondary | ICD-10-CM

## 2019-04-30 DIAGNOSIS — M79671 Pain in right foot: Secondary | ICD-10-CM

## 2019-05-29 ENCOUNTER — Other Ambulatory Visit: Payer: Self-pay | Admitting: Family Medicine

## 2019-07-07 ENCOUNTER — Other Ambulatory Visit: Payer: Self-pay | Admitting: Family Medicine

## 2019-07-23 ENCOUNTER — Other Ambulatory Visit: Payer: Self-pay | Admitting: Family Medicine

## 2019-07-27 NOTE — Telephone Encounter (Signed)
Lmom that she will need to call to make appt

## 2019-08-18 ENCOUNTER — Other Ambulatory Visit: Payer: Self-pay | Admitting: Family Medicine

## 2019-08-31 ENCOUNTER — Ambulatory Visit: Payer: 59 | Admitting: Family Medicine

## 2019-08-31 ENCOUNTER — Encounter: Payer: Self-pay | Admitting: Family Medicine

## 2019-08-31 ENCOUNTER — Other Ambulatory Visit: Payer: Self-pay

## 2019-08-31 VITALS — BP 142/90 | HR 80 | Temp 97.3°F | Resp 18 | Ht 66.0 in | Wt 188.2 lb

## 2019-08-31 DIAGNOSIS — E039 Hypothyroidism, unspecified: Secondary | ICD-10-CM

## 2019-08-31 DIAGNOSIS — Z1159 Encounter for screening for other viral diseases: Secondary | ICD-10-CM | POA: Diagnosis not present

## 2019-08-31 DIAGNOSIS — Z23 Encounter for immunization: Secondary | ICD-10-CM | POA: Diagnosis not present

## 2019-08-31 DIAGNOSIS — I10 Essential (primary) hypertension: Secondary | ICD-10-CM | POA: Diagnosis not present

## 2019-08-31 DIAGNOSIS — M1A0790 Idiopathic chronic gout, unspecified ankle and foot, without tophus (tophi): Secondary | ICD-10-CM

## 2019-08-31 DIAGNOSIS — Z0000 Encounter for general adult medical examination without abnormal findings: Secondary | ICD-10-CM

## 2019-08-31 LAB — COMPREHENSIVE METABOLIC PANEL
ALT: 65 U/L — ABNORMAL HIGH (ref 0–35)
AST: 71 U/L — ABNORMAL HIGH (ref 0–37)
Albumin: 3.9 g/dL (ref 3.5–5.2)
Alkaline Phosphatase: 118 U/L — ABNORMAL HIGH (ref 39–117)
BUN: 6 mg/dL (ref 6–23)
CO2: 25 mEq/L (ref 19–32)
Calcium: 9.4 mg/dL (ref 8.4–10.5)
Chloride: 105 mEq/L (ref 96–112)
Creatinine, Ser: 0.79 mg/dL (ref 0.40–1.20)
GFR: 74.42 mL/min (ref >60.00)
Glucose, Bld: 102 mg/dL — ABNORMAL HIGH (ref 70–99)
Potassium: 4.8 mEq/L (ref 3.5–5.1)
Sodium: 137 mEq/L (ref 135–145)
Total Bilirubin: 0.6 mg/dL (ref 0.2–1.2)
Total Protein: 7.3 g/dL (ref 6.0–8.3)

## 2019-08-31 LAB — LIPID PANEL
Cholesterol: 212 mg/dL — ABNORMAL HIGH (ref 0–200)
HDL: 81.2 mg/dL (ref >39.00)
LDL Cholesterol: 113 mg/dL — ABNORMAL HIGH (ref 0–99)
NonHDL: 131.19
Total CHOL/HDL Ratio: 3
Triglycerides: 90 mg/dL (ref 0.0–149.0)
VLDL: 18 mg/dL (ref 0.0–40.0)

## 2019-08-31 LAB — URIC ACID: Uric Acid, Serum: 7.9 mg/dL — ABNORMAL HIGH (ref 2.4–7.0)

## 2019-08-31 LAB — TSH: TSH: 0.9 u[IU]/mL (ref 0.35–4.50)

## 2019-08-31 NOTE — Progress Notes (Signed)
Patient ID: Teresa Nixon, female    DOB: [birth year]  Age: 58 y.o. MRN: [id number]    Subjective:  Subjective  HPI Vlora Manes presents for f/u bp and labs Pt would also like a shingrix vaccine.  She will get her flu shot at work.    Review of Systems  Constitutional: Negative for appetite change, diaphoresis, fatigue and unexpected weight change.  Eyes: Negative for pain, redness and visual disturbance.  Respiratory: Negative for cough, chest tightness, shortness of breath and wheezing.   Cardiovascular: Negative for chest pain, palpitations and leg swelling.  Endocrine: Negative for cold intolerance, heat intolerance, polydipsia, polyphagia and polyuria.  Genitourinary: Negative for difficulty urinating, dysuria and frequency.  Neurological: Negative for dizziness, light-headedness, numbness and headaches.    History Past Medical History:  Diagnosis Date  . Depression   . Hypertension   . Thyroid disease    hypothyroid    She has a past surgical history that includes Gastric bypass (2001); Breast reduction surgery (10/2003); and Abdominoplasty (03/2007).   Her family history includes Breast cancer in her paternal aunt; Cancer in her father and paternal grandmother; Coronary artery disease in her paternal grandfather; Diabetes in her father; Hypertension in her mother; Stroke in her father.She reports that she has never smoked. She has never used smokeless tobacco. She reports current alcohol use. She reports that she does not use drugs.  Current Outpatient Medications on File Prior to Visit  Medication Sig Dispense Refill  . allopurinol (ZYLOPRIM) 100 MG tablet TAKE ONE TABLET BY MOUTH DAILY 60 tablet 4  . ARIPiprazole (ABILIFY) 2 MG tablet Take 1 tablet by mouth daily.    . Calcium-Vitamin D (CALTRATE 600 PLUS-VIT D PO) Take 1 tablet by mouth daily.    . colchicine 0.6 MG tablet 2 tabs po once then 1 tab po q 2 hours prn pain til pain gone, max of 6 tabs in 24 hours or  intolerable diarrhea 6 tablet 1  . Cranberry 1000 MG CAPS Take 2 capsules by mouth daily.     . Cyanocobalamin (VITAMIN B 12 PO) Take 1,000 mcg by mouth once a week.      . hydrOXYzine (VISTARIL) 50 MG capsule Take 1-2 capsules by mouth as needed.    Marland Kitchen levothyroxine (SYNTHROID) 137 MCG tablet TAKE ONE TABLET BY MOUTH DAILY **MUST CALL MD FOR APPOINTMENT 15 tablet 0  . loratadine (CLARITIN) 10 MG tablet Take 10 mg by mouth daily.      . meloxicam (MOBIC) 7.5 MG tablet TAKE ONE TO TWO TABLETS BY MOUTH EVERY DAY AS NEEDED 30 tablet 0  . Multiple Vitamin (MULTIVITAMIN) tablet Take 1 tablet by mouth daily.    . TRINTELLIX 20 MG TABS tablet     . BRINTELLIX 20 MG TABS Take 20 mg by mouth daily.     No current facility-administered medications on file prior to visit.      Objective:  Objective  Physical Exam Vitals signs and nursing note reviewed.  Constitutional:      Appearance: She is well-developed.  HENT:     Head: Normocephalic and atraumatic.  Eyes:     Conjunctiva/sclera: Conjunctivae normal.  Neck:     Musculoskeletal: Normal range of motion and neck supple.     Thyroid: No thyromegaly.     Vascular: No carotid bruit or JVD.  Cardiovascular:     Rate and Rhythm: Normal rate and regular rhythm.     Heart sounds: Normal heart sounds. No murmur.  Pulmonary:  Effort: Pulmonary effort is normal. No respiratory distress.     Breath sounds: Normal breath sounds. No wheezing or rales.  Chest:     Chest wall: No tenderness.  Neurological:     Mental Status: She is alert and oriented to person, place, and time.    BP (!) 142/90 (BP Location: Left Arm, Patient Position: Sitting, Cuff Size: Normal)   Pulse 80   Temp (!) 97.3 F (36.3 C) (Temporal)   Resp 18   Ht 5\' 6"  (1.676 m)   Wt 188 lb 3.2 oz (85.4 kg)   SpO2 99%   BMI 30.38 kg/m  Wt Readings from Last 3 Encounters:  08/31/19 188 lb 3.2 oz (85.4 kg)  12/08/18 197 lb (89.4 kg)  02/10/18 195 lb 12.8 oz (88.8 kg)      Lab Results  Component Value Date   WBC 6.0 12/08/2018   HGB 13.3 12/08/2018   HCT 39.1 12/08/2018   PLT 551.0 (H) 12/08/2018   GLUCOSE 115 (H) 12/08/2018   CHOL 171 02/10/2018   TRIG 111.0 02/10/2018   HDL 49.80 02/10/2018   LDLDIRECT 133.6 04/08/2013   LDLCALC 99 02/10/2018   ALT 34 12/08/2018   AST 32 12/08/2018   NA 142 12/08/2018   K 3.9 12/08/2018   CL 106 12/08/2018   CREATININE 0.68 12/08/2018   BUN 7 12/08/2018   CO2 26 12/08/2018   TSH 1.88 02/10/2018    No results found.   Assessment & Plan:  Plan  I am having Whitnee Cookston maintain her loratadine, Cranberry, Cyanocobalamin (VITAMIN B 12 PO), multivitamin, Brintellix, ARIPiprazole, hydrOXYzine, Calcium-Vitamin D (CALTRATE 600 PLUS-VIT D PO), meloxicam, colchicine, allopurinol, levothyroxine, and Trintellix.  No orders of the defined types were placed in this encounter.   Problem List Items Addressed This Visit      Unprioritized   HYPERTENSION, BENIGN ESSENTIAL    Encouraged dash diet  Pt ate sushi last night Recheck 2 months or sooner prn       Hypothyroidism    Check labs today      Relevant Orders   TSH    Other Visit Diagnoses    Idiopathic chronic gout of foot without tophus, unspecified laterality    -  Primary   Relevant Orders   Uric acid   Essential hypertension       Relevant Orders   Lipid panel   Comprehensive metabolic panel   Need for shingles vaccine       Relevant Orders   Varicella-zoster vaccine IM   Preventative health care       Relevant Orders   Ambulatory referral to Gastroenterology   Need for hepatitis C screening test       Relevant Orders   Hepatitis C antibody      Follow-up: Return in about 8 weeks (around 10/26/2019) for hypertension,  shingrix #2 and tdap.  Ann Held, DO

## 2019-08-31 NOTE — Assessment & Plan Note (Signed)
Encouraged dash diet  Pt ate sushi last night Recheck 2 months or sooner prn

## 2019-08-31 NOTE — Assessment & Plan Note (Signed)
Check labs today.

## 2019-08-31 NOTE — Patient Instructions (Signed)

## 2019-09-01 LAB — HEPATITIS C ANTIBODY
Hepatitis C Ab: NONREACTIVE
SIGNAL TO CUT-OFF: 0.01 (ref <1.00)

## 2019-09-03 ENCOUNTER — Other Ambulatory Visit (HOSPITAL_BASED_OUTPATIENT_CLINIC_OR_DEPARTMENT_OTHER): Payer: Self-pay | Admitting: Family Medicine

## 2019-09-03 ENCOUNTER — Other Ambulatory Visit: Payer: Self-pay | Admitting: Family Medicine

## 2019-09-03 DIAGNOSIS — Z1231 Encounter for screening mammogram for malignant neoplasm of breast: Secondary | ICD-10-CM

## 2019-09-03 DIAGNOSIS — R748 Abnormal levels of other serum enzymes: Secondary | ICD-10-CM

## 2019-09-17 ENCOUNTER — Other Ambulatory Visit: Payer: Self-pay | Admitting: Family Medicine

## 2019-09-24 ENCOUNTER — Other Ambulatory Visit: Payer: Self-pay | Admitting: Family Medicine

## 2019-10-26 ENCOUNTER — Other Ambulatory Visit: Payer: Self-pay | Admitting: Family Medicine

## 2019-10-26 ENCOUNTER — Ambulatory Visit (HOSPITAL_BASED_OUTPATIENT_CLINIC_OR_DEPARTMENT_OTHER)
Admission: RE | Admit: 2019-10-26 | Discharge: 2019-10-26 | Disposition: A | Discharge status: Home / Self Care | Payer: 59 | Source: Ambulatory Visit | Attending: Family Medicine | Admitting: Family Medicine

## 2019-10-26 ENCOUNTER — Encounter: Payer: Self-pay | Admitting: Family Medicine

## 2019-10-26 ENCOUNTER — Other Ambulatory Visit: Payer: Self-pay

## 2019-10-26 ENCOUNTER — Encounter (HOSPITAL_BASED_OUTPATIENT_CLINIC_OR_DEPARTMENT_OTHER): Payer: Self-pay

## 2019-10-26 ENCOUNTER — Ambulatory Visit: Payer: 59 | Admitting: Family Medicine

## 2019-10-26 VITALS — BP 110/78 | HR 80 | Temp 97.5°F | Resp 18 | Ht 66.0 in | Wt 186.0 lb

## 2019-10-26 DIAGNOSIS — R748 Abnormal levels of other serum enzymes: Secondary | ICD-10-CM

## 2019-10-26 DIAGNOSIS — Z1211 Encounter for screening for malignant neoplasm of colon: Secondary | ICD-10-CM

## 2019-10-26 DIAGNOSIS — Z23 Encounter for immunization: Secondary | ICD-10-CM

## 2019-10-26 DIAGNOSIS — I10 Essential (primary) hypertension: Secondary | ICD-10-CM

## 2019-10-26 DIAGNOSIS — Z1231 Encounter for screening mammogram for malignant neoplasm of breast: Secondary | ICD-10-CM | POA: Insufficient documentation

## 2019-10-26 DIAGNOSIS — R7989 Other specified abnormal findings of blood chemistry: Secondary | ICD-10-CM

## 2019-10-26 LAB — HEPATIC FUNCTION PANEL
ALT: 46 U/L — ABNORMAL HIGH (ref 0–35)
AST: 48 U/L — ABNORMAL HIGH (ref 0–37)
Albumin: 3.8 g/dL (ref 3.5–5.2)
Alkaline Phosphatase: 122 U/L — ABNORMAL HIGH (ref 39–117)
Bilirubin, Direct: 0.1 mg/dL (ref 0.0–0.3)
Total Bilirubin: 0.5 mg/dL (ref 0.2–1.2)
Total Protein: 7.2 g/dL (ref 6.0–8.3)

## 2019-10-26 LAB — GAMMA GT: GGT: 173 U/L — ABNORMAL HIGH (ref 7–51)

## 2019-10-26 NOTE — Patient Instructions (Signed)

## 2019-10-26 NOTE — Progress Notes (Signed)
Patient ID: Teresa Nixon, female    DOB: 08/05/1961  Age: 58 y.o. MRN: [id number]    Subjective:  Subjective  HPI Teresa Nixon presents for BP f/u. No complaints   Review of Systems  Constitutional: Negative for appetite change, diaphoresis, fatigue and unexpected weight change.  Eyes: Negative for pain, redness and visual disturbance.  Respiratory: Negative for cough, chest tightness, shortness of breath and wheezing.   Cardiovascular: Negative for chest pain, palpitations and leg swelling.  Endocrine: Negative for cold intolerance, heat intolerance, polydipsia, polyphagia and polyuria.  Genitourinary: Negative for difficulty urinating, dysuria and frequency.  Neurological: Negative for dizziness, light-headedness, numbness and headaches.    History Past Medical History:  Diagnosis Date  . Depression   . Hypertension   . Thyroid disease    hypothyroid    She has a past surgical history that includes Gastric bypass (2001); Breast reduction surgery (10/2003); Abdominoplasty (03/2007); Reduction mammaplasty; and Breast biopsy (Left).   Her family history includes Breast cancer in her paternal aunt; Cancer in her father and paternal grandmother; Coronary artery disease in her paternal grandfather; Diabetes in her father; Hypertension in her mother; Stroke in her father.She reports that she has never smoked. She has never used smokeless tobacco. She reports current alcohol use. She reports that she does not use drugs.  Current Outpatient Medications on File Prior to Visit  Medication Sig Dispense Refill  . allopurinol (ZYLOPRIM) 100 MG tablet TAKE ONE TABLET BY MOUTH DAILY 60 tablet 4  . ARIPiprazole (ABILIFY) 2 MG tablet Take 1 tablet by mouth daily.    Marland Kitchen BRINTELLIX 20 MG TABS Take 20 mg by mouth daily.    . Calcium-Vitamin D (CALTRATE 600 PLUS-VIT D PO) Take 1 tablet by mouth daily.    . colchicine 0.6 MG tablet 2 tabs po once then 1 tab po q 2 hours prn pain til pain gone, max of 6  tabs in 24 hours or intolerable diarrhea 6 tablet 1  . Cranberry 1000 MG CAPS Take 2 capsules by mouth daily.     . Cyanocobalamin (VITAMIN B 12 PO) Take 1,000 mcg by mouth once a week.      . hydrOXYzine (VISTARIL) 50 MG capsule Take 1-2 capsules by mouth as needed.    Marland Kitchen levothyroxine (SYNTHROID) 137 MCG tablet Take 1 tablet (137 mcg total) by mouth daily. 90 tablet 1  . loratadine (CLARITIN) 10 MG tablet Take 10 mg by mouth daily.      . meloxicam (MOBIC) 7.5 MG tablet TAKE ONE TO TWO TABLETS BY MOUTH EVERY DAY AS NEEDED 30 tablet 0  . Multiple Vitamin (MULTIVITAMIN) tablet Take 1 tablet by mouth daily.    . TRINTELLIX 20 MG TABS tablet      No current facility-administered medications on file prior to visit.     Objective:  Objective  Physical Exam Vitals and nursing note reviewed.  Constitutional:      Appearance: She is well-developed.  HENT:     Head: Normocephalic and atraumatic.  Eyes:     Conjunctiva/sclera: Conjunctivae normal.  Neck:     Thyroid: No thyromegaly.     Vascular: No carotid bruit or JVD.  Cardiovascular:     Rate and Rhythm: Normal rate and regular rhythm.     Heart sounds: Normal heart sounds. No murmur.  Pulmonary:     Effort: Pulmonary effort is normal. No respiratory distress.     Breath sounds: Normal breath sounds. No wheezing or rales.  Chest:  Chest wall: No tenderness.  Musculoskeletal:     Cervical back: Normal range of motion and neck supple.  Neurological:     Mental Status: She is alert and oriented to person, place, and time.    BP 110/78 (BP Location: Right Arm, Patient Position: Sitting, Cuff Size: Normal)   Pulse 80   Temp (!) 97.5 F (36.4 C) (Temporal)   Resp 18   Ht 5\' 6"  (1.676 m)   Wt 186 lb (84.4 kg)   SpO2 98%   BMI 30.02 kg/m  Wt Readings from Last 3 Encounters:  10/26/19 186 lb (84.4 kg)  08/31/19 188 lb 3.2 oz (85.4 kg)  12/08/18 197 lb (89.4 kg)     Lab Results  Component Value Date   WBC 6.0 12/08/2018    HGB 13.3 12/08/2018   HCT 39.1 12/08/2018   PLT 551.0 (H) 12/08/2018   GLUCOSE 102 (H) 08/31/2019   CHOL 212 (H) 08/31/2019   TRIG 90.0 08/31/2019   HDL 81.20 08/31/2019   LDLDIRECT 133.6 04/08/2013   LDLCALC 113 (H) 08/31/2019   ALT 65 (H) 08/31/2019   AST 71 (H) 08/31/2019   NA 137 08/31/2019   K 4.8 08/31/2019   CL 105 08/31/2019   CREATININE 0.79 08/31/2019   BUN 6 08/31/2019   CO2 25 08/31/2019   TSH 0.90 08/31/2019    No results found.   Assessment & Plan:  Plan  I am having Teresa Nixon maintain her loratadine, Cranberry, Cyanocobalamin (VITAMIN B 12 PO), multivitamin, Brintellix, ARIPiprazole, hydrOXYzine, Calcium-Vitamin D (CALTRATE 600 PLUS-VIT D PO), meloxicam, colchicine, allopurinol, Trintellix, and levothyroxine.  No orders of the defined types were placed in this encounter.   Problem List Items Addressed This Visit      Unprioritized   HYPERTENSION, BENIGN ESSENTIAL    Well controlled, no changes to meds. Encouraged heart healthy diet such as the DASH diet and exercise as tolerated.        Other Visit Diagnoses    Need for shingles vaccine    -  Primary   Relevant Orders   Varicella-zoster vaccine IM (Shingrix) (Completed)   Colon cancer screening       Relevant Orders   Ambulatory referral to Gastroenterology   Elevated liver enzymes          Follow-up: Return in about 6 months (around 04/25/2020), or if symptoms worsen or fail to improve, for annual exam, fasting.  Ann Held, DO

## 2019-10-26 NOTE — Assessment & Plan Note (Signed)
Well controlled, no changes to meds. Encouraged heart healthy diet such as the DASH diet and exercise as tolerated.  °

## 2020-01-23 ENCOUNTER — Ambulatory Visit: Payer: 59

## 2020-01-23 ENCOUNTER — Ambulatory Visit: Payer: 59 | Attending: Internal Medicine

## 2020-01-23 DIAGNOSIS — Z23 Encounter for immunization: Secondary | ICD-10-CM

## 2020-01-23 NOTE — Progress Notes (Signed)
   Covid-19 Vaccination Clinic  Name:  Teresa Nixon    MRN: [id number] DOB: 08-06-1961  01/23/2020  Teresa Nixon was observed post Covid-19 immunization for 15 minutes without incident. She was provided with Vaccine Information Sheet and instruction to access the V-Safe system.   Teresa Nixon was instructed to call 911 with any severe reactions post vaccine: Marland Kitchen Difficulty breathing  . Swelling of face and throat  . A fast heartbeat  . A bad rash all over body  . Dizziness and weakness   Immunizations Administered    Name Date Dose VIS Date Route   Pfizer COVID-19 Vaccine 01/23/2020 12:35 PM 0.3 mL 10/23/2019 Intramuscular   Manufacturer: [place]   Lot: [id number]   [place]: [id number]

## 2020-02-17 ENCOUNTER — Ambulatory Visit: Payer: 59 | Attending: Internal Medicine

## 2020-02-17 DIAGNOSIS — Z23 Encounter for immunization: Secondary | ICD-10-CM

## 2020-02-17 NOTE — Progress Notes (Signed)
   Covid-19 Vaccination Clinic  Name:  Teresa Nixon    MRN: [id number] DOB: 28-Oct-1961  02/17/2020  [name] was observed post Covid-19 immunization for 15 minutes without incident. She was provided with Vaccine Information Sheet and instruction to access the V-Safe system.   [name] was instructed to call 911 with any severe reactions post vaccine: Marland Kitchen Difficulty breathing  . Swelling of face and throat  . A fast heartbeat  . A bad rash all over body  . Dizziness and weakness   Immunizations Administered    Name Date Dose VIS Date Route   Pfizer COVID-19 Vaccine 02/17/2020  3:59 PM 0.3 mL 10/23/2019 Intramuscular   Manufacturer: [place]   Lot: [id number]   [place]: [id number]

## 2021-12-06 IMAGING — MG DIGITAL SCREENING BILAT W/ TOMO W/ CAD
6 of 10 series · 6 of 30 positions shown · non-contrast
Comparison: Left breast mammograms from 12/23/2014.

ACR Breast Density Category a: The breast tissue is almost entirely
fatty.

CLINICAL DATA: Screening.

EXAM:
DIGITAL SCREENING BILATERAL MAMMOGRAM WITH TOMO AND CAD

[R CC synth-2D]
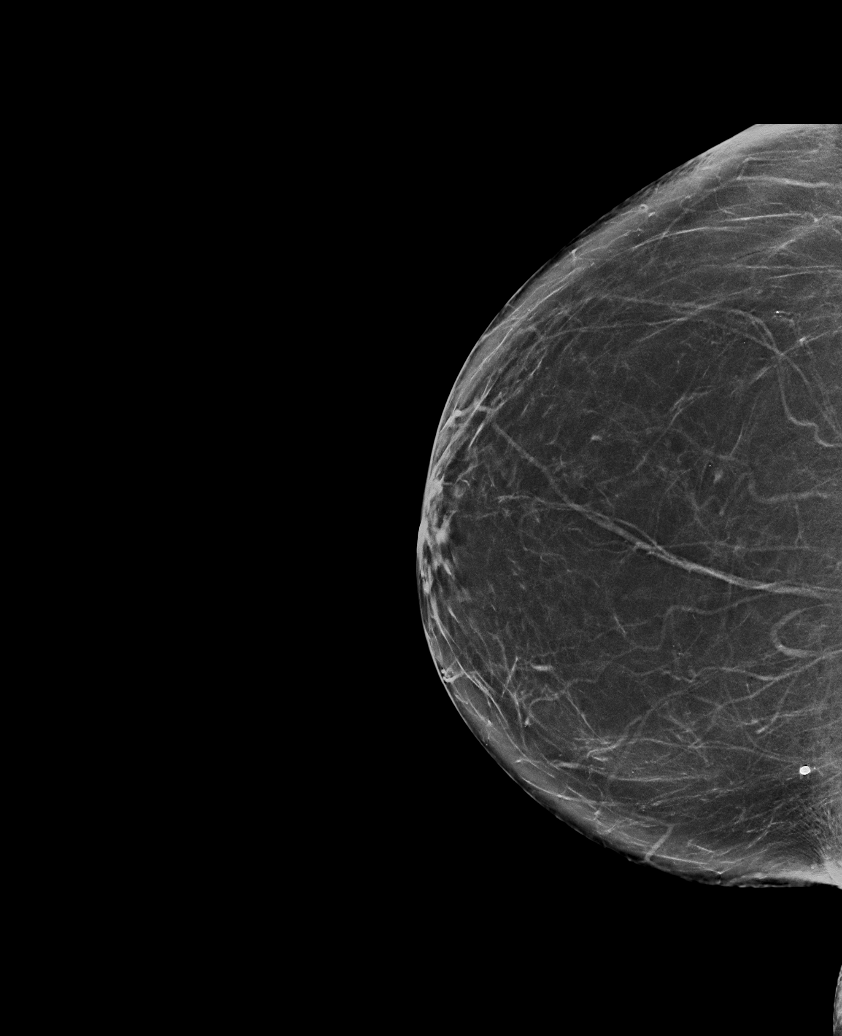

[L CC synth-2D]
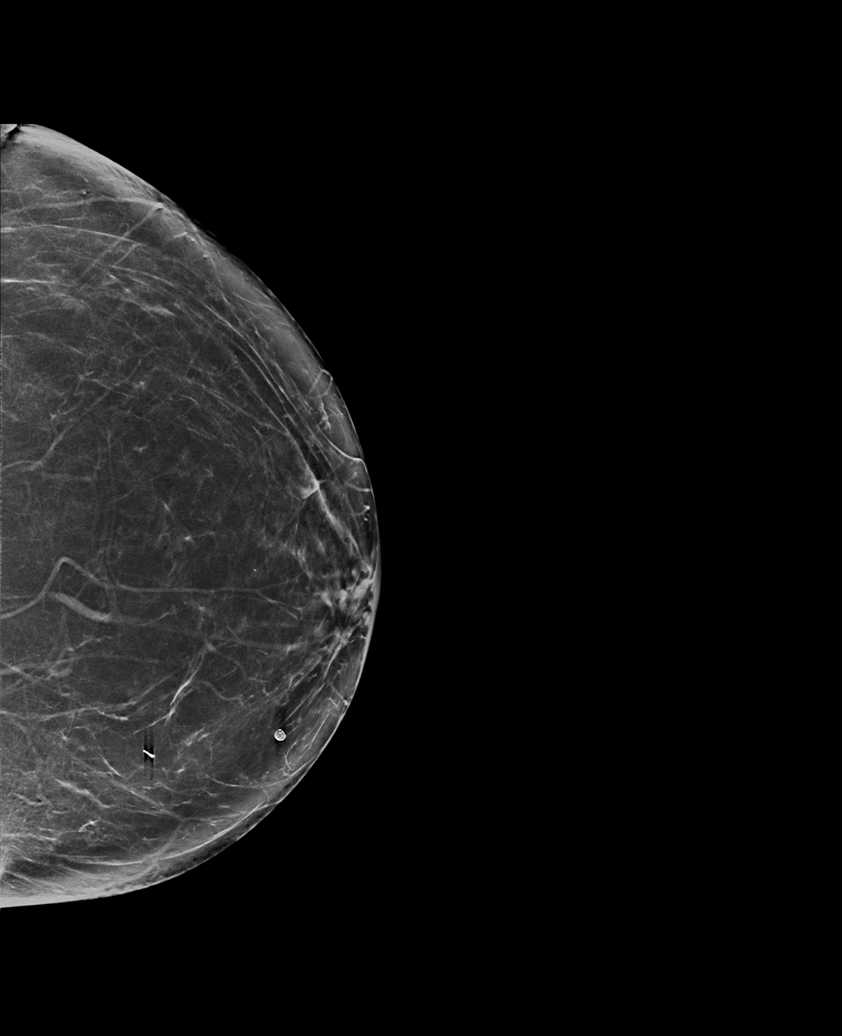

[L MLO synth-2D]
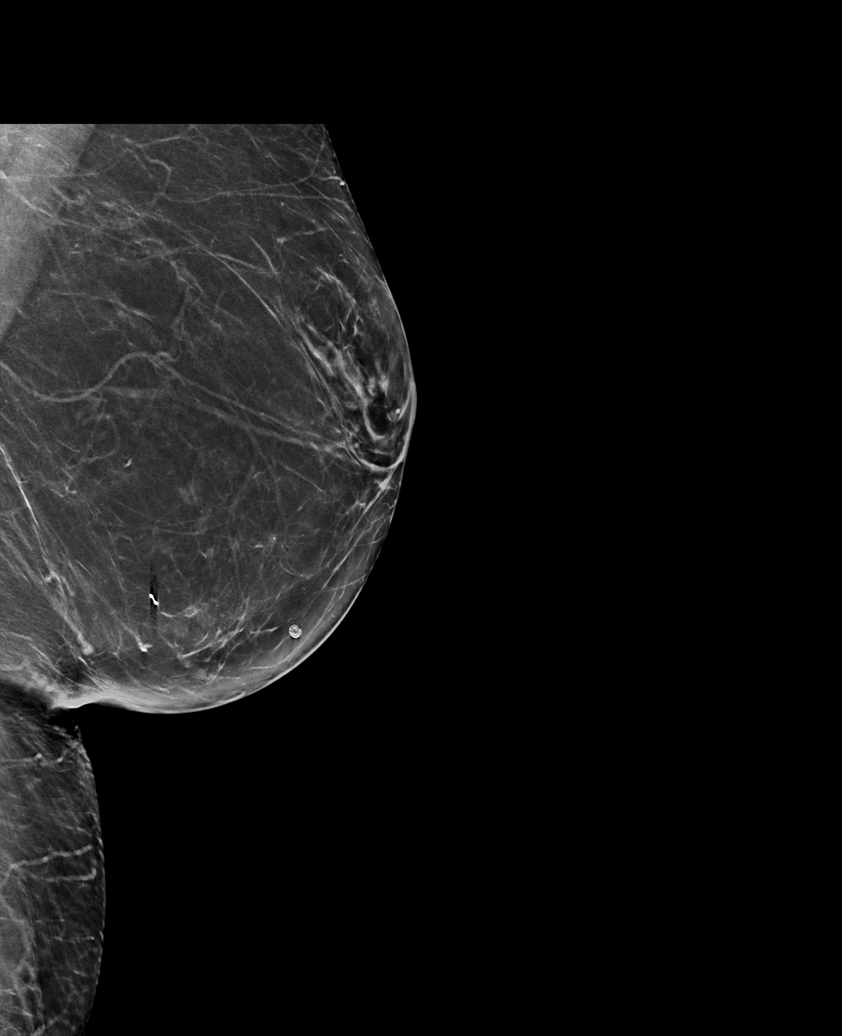

[R MLO synth-2D]
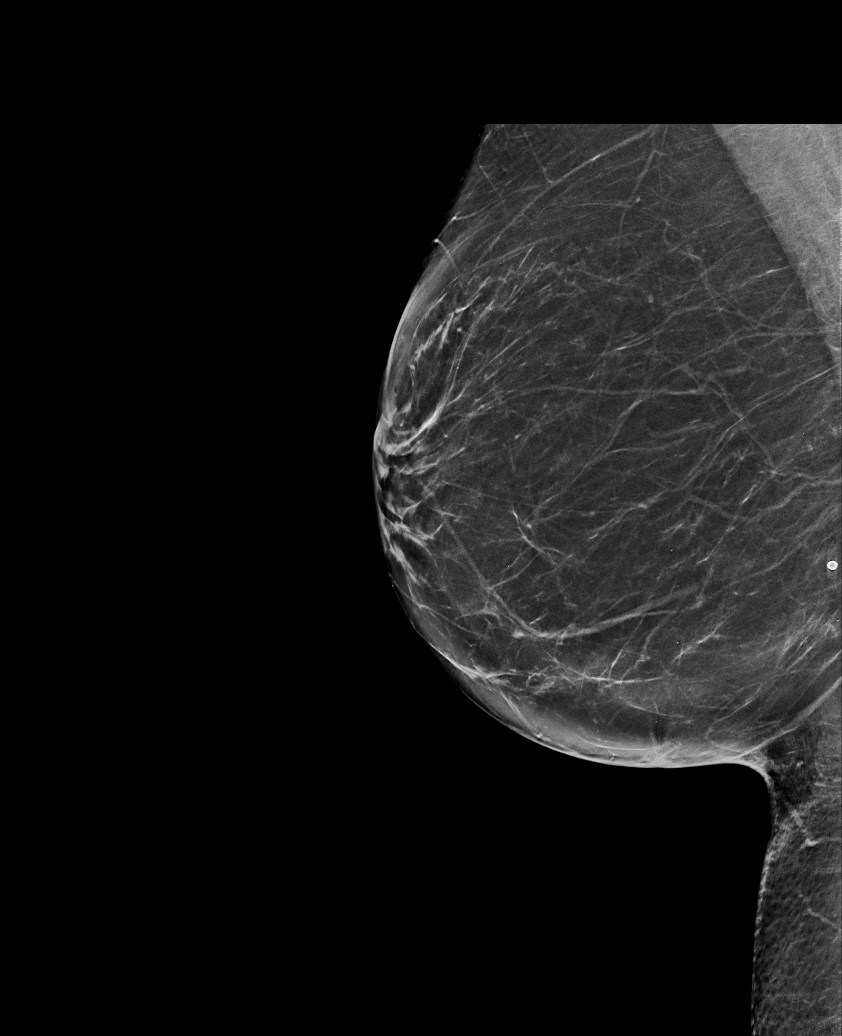

[R XCCL synth-2D]
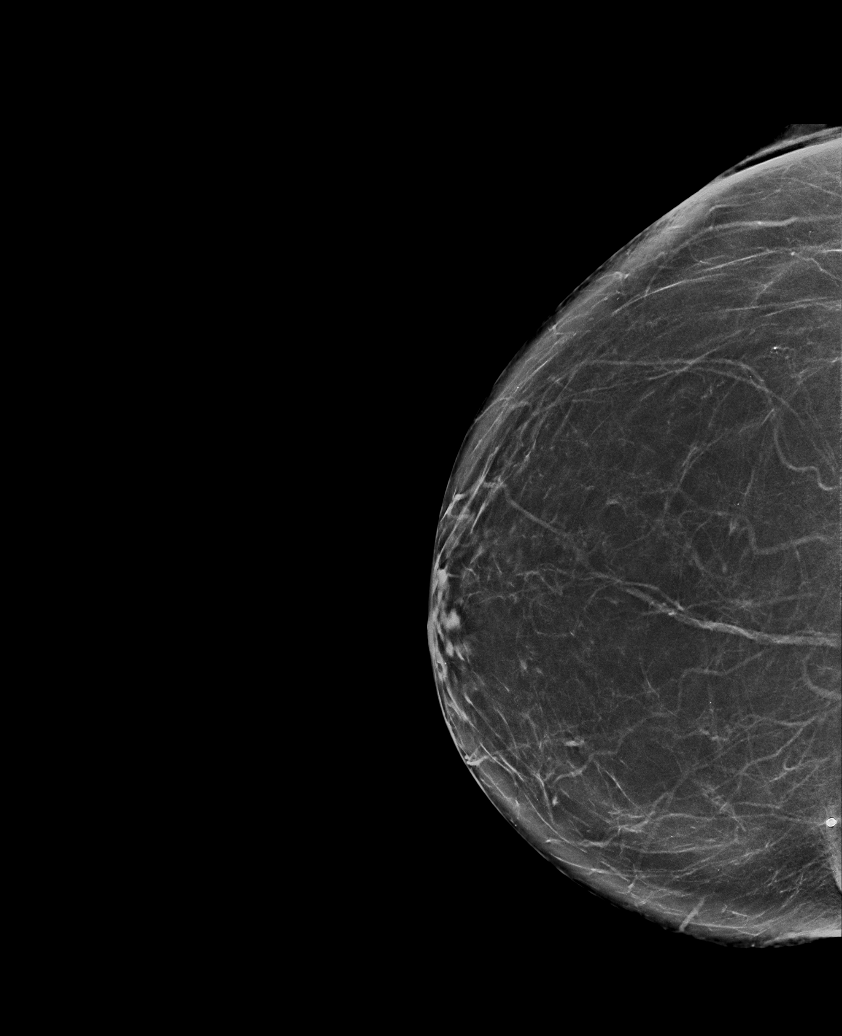

[L CC tomo · tomo slice 37/74.0]
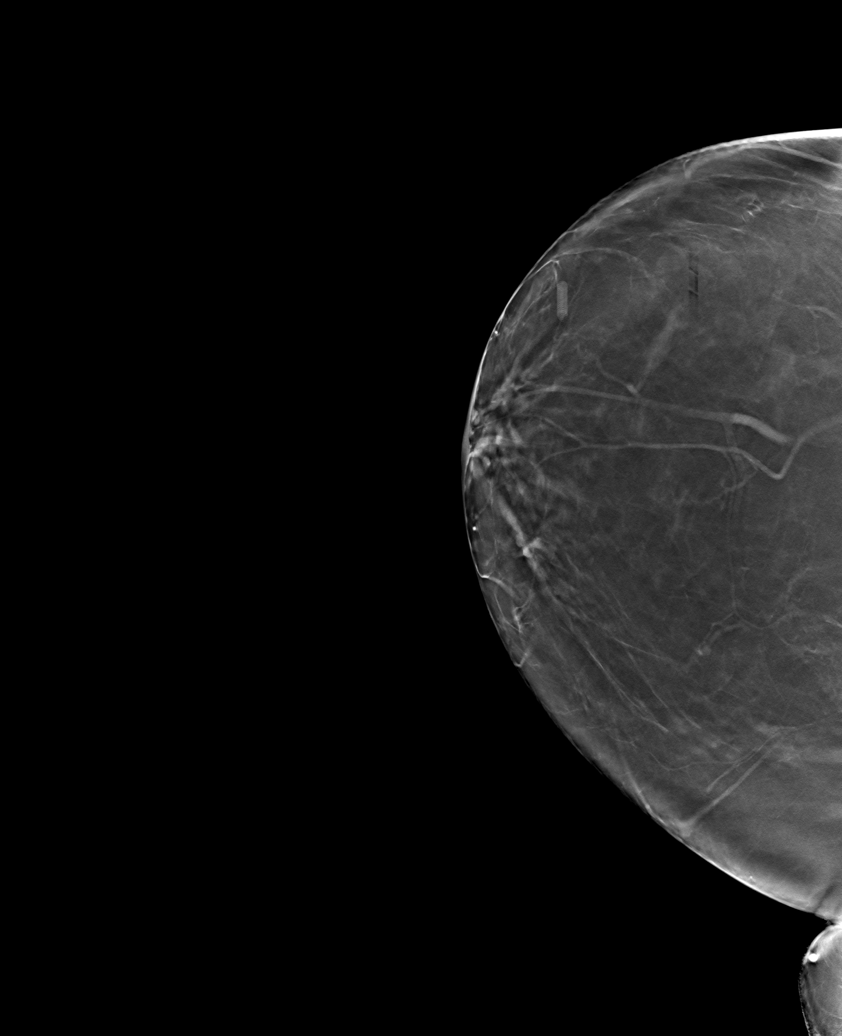

[6 of 30 positions shown; findings below may reference images not displayed]

FINDINGS: There are no findings suspicious for malignancy. Images were
processed with CAD.
IMPRESSION: No mammographic evidence of malignancy. A result letter of this
screening mammogram will be mailed directly to the patient.

RECOMMENDATION:
Screening mammogram in one year. (Code:[id number])

BI-RADS CATEGORY  1: Negative.
